# Patient Record
Sex: Female | Born: 1990 | Race: White | Hispanic: No | Marital: Single | Smoking: Current every day smoker
Health system: Southern US, Community
[De-identification: ages and names within clinical notes are randomized; demographics above are authoritative.]

## PROBLEM LIST (undated history)

## (undated) DIAGNOSIS — I10 Essential (primary) hypertension: Secondary | ICD-10-CM

## (undated) DIAGNOSIS — Z9889 Other specified postprocedural states: Secondary | ICD-10-CM

## (undated) DIAGNOSIS — K219 Gastro-esophageal reflux disease without esophagitis: Secondary | ICD-10-CM

## (undated) DIAGNOSIS — Z87442 Personal history of urinary calculi: Secondary | ICD-10-CM

## (undated) DIAGNOSIS — R112 Nausea with vomiting, unspecified: Secondary | ICD-10-CM

## (undated) DIAGNOSIS — O24419 Gestational diabetes mellitus in pregnancy, unspecified control: Secondary | ICD-10-CM

## (undated) HISTORY — PX: TONSILLECTOMY: SUR1361

## (undated) HISTORY — PX: WISDOM TOOTH EXTRACTION: SHX21

## (undated) HISTORY — DX: Essential (primary) hypertension: I10

## (undated) HISTORY — DX: Gestational diabetes mellitus in pregnancy, unspecified control: O24.419

---
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
## 2010-05-04 ENCOUNTER — Encounter: Payer: Self-pay | Admitting: Obstetrics and Gynecology

## 2010-06-08 ENCOUNTER — Encounter: Payer: Self-pay | Admitting: Maternal and Fetal Medicine

## 2010-06-16 ENCOUNTER — Observation Stay: Payer: Self-pay | Admitting: Obstetrics & Gynecology

## 2010-06-18 ENCOUNTER — Inpatient Hospital Stay: Payer: Self-pay | Admitting: Obstetrics & Gynecology

## 2010-06-21 ENCOUNTER — Observation Stay: Payer: Self-pay | Admitting: Obstetrics and Gynecology

## 2010-06-22 ENCOUNTER — Observation Stay: Payer: Self-pay

## 2010-06-26 ENCOUNTER — Observation Stay: Payer: Self-pay | Admitting: Obstetrics and Gynecology

## 2010-06-29 ENCOUNTER — Encounter: Payer: Self-pay | Admitting: Maternal and Fetal Medicine

## 2010-07-06 ENCOUNTER — Encounter: Payer: Self-pay | Admitting: Maternal and Fetal Medicine

## 2010-07-09 ENCOUNTER — Inpatient Hospital Stay: Payer: Self-pay | Admitting: Obstetrics and Gynecology

## 2010-11-25 ENCOUNTER — Emergency Department: Payer: Self-pay | Admitting: Emergency Medicine

## 2010-12-31 ENCOUNTER — Emergency Department: Payer: Self-pay | Admitting: Emergency Medicine

## 2011-01-15 ENCOUNTER — Ambulatory Visit: Payer: Self-pay | Admitting: Urology

## 2011-01-26 ENCOUNTER — Ambulatory Visit: Payer: Self-pay | Admitting: Urology

## 2013-08-22 ENCOUNTER — Emergency Department: Payer: Self-pay | Admitting: Emergency Medicine

## 2014-11-18 ENCOUNTER — Emergency Department: Admit: 2014-11-18 | Disposition: A | Discharge status: Home / Self Care | Payer: Self-pay | Admitting: Student

## 2015-09-30 ENCOUNTER — Encounter: Payer: Self-pay | Admitting: Emergency Medicine

## 2015-09-30 ENCOUNTER — Emergency Department
Admission: EM | Admit: 2015-09-30 | Discharge: 2015-09-30 | Disposition: A | Discharge status: Home / Self Care | Payer: Self-pay | Attending: Emergency Medicine | Admitting: Emergency Medicine

## 2015-09-30 DIAGNOSIS — F17200 Nicotine dependence, unspecified, uncomplicated: Secondary | ICD-10-CM | POA: Insufficient documentation

## 2015-09-30 DIAGNOSIS — M546 Pain in thoracic spine: Secondary | ICD-10-CM | POA: Insufficient documentation

## 2015-09-30 DIAGNOSIS — G8929 Other chronic pain: Secondary | ICD-10-CM | POA: Insufficient documentation

## 2015-09-30 DIAGNOSIS — M62830 Muscle spasm of back: Secondary | ICD-10-CM | POA: Insufficient documentation

## 2015-09-30 DIAGNOSIS — M545 Low back pain: Secondary | ICD-10-CM | POA: Insufficient documentation

## 2015-09-30 MED ORDER — CYCLOBENZAPRINE HCL 10 MG PO TABS: 10.0000 mg | ORAL_TABLET | Freq: Three times a day (TID) | ORAL | Status: DC | PRN

## 2015-09-30 MED ORDER — NAPROXEN 500 MG PO TABS: 500.0000 mg | ORAL_TABLET | Freq: Two times a day (BID) | ORAL | Status: DC

## 2015-09-30 MED ORDER — KETOROLAC TROMETHAMINE 60 MG/2ML IM SOLN
60.0000 mg | Freq: Once | INTRAMUSCULAR | Status: AC
  Administered 2015-09-30: 60 mg via INTRAMUSCULAR
  Filled 2015-09-30: qty 2

## 2015-09-30 NOTE — ED Notes (Signed)
Pt to ed with c/o chronic back pain, worse over the last few days, denies injury.

## 2015-09-30 NOTE — ED Provider Notes (Signed)
[facility] Emergency Department Provider Note  ____________________________________________  Time seen: Approximately 8:34 PM  I have reviewed the triage vital signs and the nursing notes.   HISTORY  Chief Complaint Back Pain    HPI Angelica Fisher is a 25 y.o. female , NAD, presents to the emergency department with lower back pain that is chronic in nature but worsening over the last few days. Denies any injury, trauma, falls. Not had any numbness, tingling, weakness. No saddle paresthesias nor loss of bowel or bladder control. Has been taking over-the-counter medications without any improvement. Notes the pain makes it difficult for her to stand upright. Patient has not been followed over the years that she does not have insurance nor a primary care provider.   History reviewed. No pertinent past medical history.  There are no active problems to display for this patient.   History reviewed. No pertinent past surgical history.  Current Outpatient Rx  Name  Route  Sig  Dispense  Refill  . cyclobenzaprine (FLEXERIL) 10 MG tablet   Oral   Take 1 tablet (10 mg total) by mouth 3 (three) times daily as needed for muscle spasms.   21 tablet   0   . naproxen (NAPROSYN) 500 MG tablet   Oral   Take 1 tablet (500 mg total) by mouth 2 (two) times daily with a meal.   30 tablet   0     Allergies Review of patient's allergies indicates no known allergies.  History reviewed. No pertinent family history.  Social History Social History  Substance Use Topics  . Smoking status: Current Every Day Smoker  . Smokeless tobacco: None  . Alcohol Use: Yes     Review of Systems  Constitutional: No fever/chills Cardiovascular: No chest pain. Respiratory:  No shortness of breath. No wheezing.  Gastrointestinal: No abdominal pain.  No nausea, vomiting.   Musculoskeletal: Positive for back pain.  Skin: Negative for rash. Neurological: Negative for headaches,  focal weakness or numbness. No saddle paresthesias 10-point ROS otherwise negative.  ____________________________________________   PHYSICAL EXAM:  VITAL SIGNS: ED Triage Vitals  Enc Vitals Group     BP 09/30/15 1852 155/90 mmHg     Pulse Rate 09/30/15 1852 86     Resp 09/30/15 1852 20     Temp 09/30/15 1852 98.3 F (36.8 C)     Temp Source 09/30/15 1852 Oral     SpO2 09/30/15 1852 99 %     Weight 09/30/15 1852 250 lb (113.399 kg)     Height 09/30/15 1852  (1.753 m)     Head Cir --      Peak Flow --      Pain Score 09/30/15 1852 8     Pain Loc --      Pain Edu? --      Excl. in GC? --     Constitutional: Alert and oriented. Well appearing and in no acute distress. Eyes: Conjunctivae are normal. PERRL. EOMI without pain.  Head: Atraumatic. Hematological/Lymphatic/Immunilogical: No cervical lymphadenopathy. Cardiovascular:  Good peripheral circulation. Respiratory: Normal respiratory effort without tachypnea or retractions.  Musculoskeletal: Tenderness about the thoracic and lumbar spine is diffusely. No paraspinal tenderness. Mild muscle spasm noted to the right paraspinal lower thoracic region. No lower extremity tenderness nor edema.  No joint effusions. Neurologic:  Normal speech and language. No gross focal neurologic deficits are appreciated.  Skin:  Skin is warm, dry and intact. No rash noted. Psychiatric: Mood and affect  are normal. Speech and behavior are normal. Patient exhibits appropriate insight and judgement.   ____________________________________________   LABS  None  ____________________________________________  EKG  None ____________________________________________  RADIOLOGY  None  ____________________________________________    PROCEDURES  Procedure(s) performed: None    Medications  ketorolac (TORADOL) injection 60 mg (60 mg Intramuscular Given 09/30/15 2050)     ____________________________________________   INITIAL  IMPRESSION / ASSESSMENT AND PLAN / ED COURSE  Patient's diagnosis is consistent with chronic lumbago. Patient noted some improvement in pain after administration of Toradol while in the emergency department. Patient will be discharged home with prescriptions for naproxen and Flexeril to take as prescribed. Patient is to follow up with [facility] community health clinic to establish care and if symptoms persist past this treatment course. Patient is given ED precautions to return to the ED for any worsening or new symptoms.    ____________________________________________  FINAL CLINICAL IMPRESSION(S) / ED DIAGNOSES  Final diagnoses:  Chronic bilateral low back pain without sciatica      NEW MEDICATIONS STARTED DURING THIS VISIT:  New Prescriptions   CYCLOBENZAPRINE (FLEXERIL) 10 MG TABLET    Take 1 tablet (10 mg total) by mouth 3 (three) times daily as needed for muscle spasms.   NAPROXEN (NAPROSYN) 500 MG TABLET    Take 1 tablet (500 mg total) by mouth 2 (two) times daily with a meal.         Hope Pigeon, PA-C 09/30/15 2133  Rockne Menghini, MD 10/01/15 [phone]

## 2015-09-30 NOTE — Discharge Instructions (Signed)

## 2020-05-22 ENCOUNTER — Other Ambulatory Visit: Payer: Self-pay

## 2020-05-22 ENCOUNTER — Ambulatory Visit (LOCAL_COMMUNITY_HEALTH_CENTER): Payer: Self-pay

## 2020-05-22 VITALS — BP 151/98 | Ht 69.0 in | Wt 252.0 lb

## 2020-05-22 DIAGNOSIS — Z3201 Encounter for pregnancy test, result positive: Secondary | ICD-10-CM

## 2020-05-22 MED ORDER — PRENATAL 27-0.8 MG PO TABS: 1.0000 | ORAL_TABLET | Freq: Every day | ORAL | 0 refills | Status: AC

## 2020-05-22 NOTE — Progress Notes (Signed)
UPT positive today. BP elevated today at 151/98. Reports hx of HBP. No follow up with BP over past yr. Currently, not taking HBP meds although has been prescribed in past by Phineas Real. Reports productive cough with clear mucus since Saturday. Denies fever. RN observed pt coughing persistently during office visit today. Consult Hazle Coca, CNM who advises pt to be tested for COVID today. Recommends hot tea/honey and cough drops for cough. Advises pt to establish prenatal care ASAP based on HBP and past  high risk OB history. RN reviewed provider recommendations with pt. [employer] information given (handout) to pt for COVID testing. To seek immediate medical attn. If symptoms worsen. Pt plans prenatal care at [facility] and plans to call tomorrow. Questions answered and reports understanding. Jerel Shepherd, RN

## 2020-05-23 LAB — PREGNANCY, URINE: Preg Test, Ur: POSITIVE — AB

## 2020-05-27 NOTE — Progress Notes (Signed)
Consulted on the plan of care for this client.  I agree with the documented note and actions taken to provide care for this client. 

## 2020-06-03 DIAGNOSIS — O3680X0 Pregnancy with inconclusive fetal viability, not applicable or unspecified: Secondary | ICD-10-CM | POA: Diagnosis not present

## 2020-06-03 DIAGNOSIS — Z3201 Encounter for pregnancy test, result positive: Secondary | ICD-10-CM | POA: Diagnosis not present

## 2020-06-03 DIAGNOSIS — N912 Amenorrhea, unspecified: Secondary | ICD-10-CM | POA: Diagnosis not present

## 2020-06-09 NOTE — Progress Notes (Signed)
Follow-up on +PT at [facility] on 05/22/20. Patient has new OB appointment scheduled at [facility] on 07/08/20 per [facility] notes in Epic.Marland KitchenBurt Knack, RN

## 2020-07-07 DIAGNOSIS — O0992 Supervision of high risk pregnancy, unspecified, second trimester: Secondary | ICD-10-CM | POA: Insufficient documentation

## 2020-07-08 DIAGNOSIS — Z3481 Encounter for supervision of other normal pregnancy, first trimester: Secondary | ICD-10-CM | POA: Diagnosis not present

## 2020-07-08 DIAGNOSIS — O0991 Supervision of high risk pregnancy, unspecified, first trimester: Secondary | ICD-10-CM | POA: Diagnosis not present

## 2020-07-08 DIAGNOSIS — Z114 Encounter for screening for human immunodeficiency virus [HIV]: Secondary | ICD-10-CM | POA: Diagnosis not present

## 2020-07-08 DIAGNOSIS — Z124 Encounter for screening for malignant neoplasm of cervix: Secondary | ICD-10-CM | POA: Diagnosis not present

## 2020-07-08 DIAGNOSIS — O10011 Pre-existing essential hypertension complicating pregnancy, first trimester: Secondary | ICD-10-CM | POA: Diagnosis not present

## 2020-07-08 DIAGNOSIS — Z6837 Body mass index (BMI) 37.0-37.9, adult: Secondary | ICD-10-CM | POA: Diagnosis not present

## 2020-07-08 DIAGNOSIS — Z113 Encounter for screening for infections with a predominantly sexual mode of transmission: Secondary | ICD-10-CM | POA: Diagnosis not present

## 2020-07-08 LAB — OB RESULTS CONSOLE RUBELLA ANTIBODY, IGM: Rubella: IMMUNE

## 2020-07-08 LAB — OB RESULTS CONSOLE RPR: RPR: NONREACTIVE

## 2020-07-08 LAB — OB RESULTS CONSOLE GC/CHLAMYDIA
Chlamydia: NEGATIVE
Gonorrhea: NEGATIVE

## 2020-07-08 LAB — OB RESULTS CONSOLE VARICELLA ZOSTER ANTIBODY, IGG: Varicella: IMMUNE

## 2020-07-08 LAB — OB RESULTS CONSOLE HIV ANTIBODY (ROUTINE TESTING): HIV: NONREACTIVE

## 2020-07-08 LAB — OB RESULTS CONSOLE HEPATITIS B SURFACE ANTIGEN: Hepatitis B Surface Ag: NEGATIVE

## 2020-07-21 DIAGNOSIS — I10 Essential (primary) hypertension: Secondary | ICD-10-CM | POA: Diagnosis not present

## 2020-07-21 DIAGNOSIS — R7309 Other abnormal glucose: Secondary | ICD-10-CM | POA: Diagnosis not present

## 2020-08-05 DIAGNOSIS — Z3480 Encounter for supervision of other normal pregnancy, unspecified trimester: Secondary | ICD-10-CM | POA: Diagnosis not present

## 2020-08-05 DIAGNOSIS — O10012 Pre-existing essential hypertension complicating pregnancy, second trimester: Secondary | ICD-10-CM | POA: Diagnosis not present

## 2020-08-05 DIAGNOSIS — G8929 Other chronic pain: Secondary | ICD-10-CM | POA: Diagnosis not present

## 2020-08-05 DIAGNOSIS — O24419 Gestational diabetes mellitus in pregnancy, unspecified control: Secondary | ICD-10-CM | POA: Diagnosis not present

## 2020-08-05 DIAGNOSIS — M25562 Pain in left knee: Secondary | ICD-10-CM | POA: Diagnosis not present

## 2020-08-06 ENCOUNTER — Encounter: Payer: Self-pay | Admitting: *Deleted

## 2020-08-06 ENCOUNTER — Other Ambulatory Visit: Payer: Self-pay

## 2020-08-06 ENCOUNTER — Encounter: Payer: Medicaid Other | Attending: Certified Nurse Midwife | Admitting: *Deleted

## 2020-08-06 VITALS — BP 120/78 | Ht 69.0 in | Wt 262.8 lb

## 2020-08-06 DIAGNOSIS — Z3A00 Weeks of gestation of pregnancy not specified: Secondary | ICD-10-CM | POA: Diagnosis not present

## 2020-08-06 DIAGNOSIS — O24410 Gestational diabetes mellitus in pregnancy, diet controlled: Secondary | ICD-10-CM

## 2020-08-06 DIAGNOSIS — O24419 Gestational diabetes mellitus in pregnancy, unspecified control: Secondary | ICD-10-CM | POA: Diagnosis present

## 2020-08-06 NOTE — Progress Notes (Signed)
Diabetes Self-Management Education  Visit Type: First/Initial  Appt. Start Time: 0815 Appt. End Time: 1015  08/06/2020  [name], identified by name and date of birth, is a 29 y.o. female with a diagnosis of Diabetes: Gestational Diabetes.   ASSESSMENT  Blood pressure 120/78, height 5\' 9"  (1.753 m), weight 262 lb 12.8 oz (119.2 kg), last menstrual period 04/14/2020, estimated date of delivery 01/19/2021 Body mass index is 38.81 kg/m.   Diabetes Self-Management Education - 08/06/20 1135      Visit Information   Visit Type First/Initial      Initial Visit   Diabetes Type Gestational Diabetes    Are you currently following a meal plan? No    Are you taking your medications as prescribed? Yes    Date Diagnosed 3 weeks ago      Health Coping   How would you rate your overall health? Good      Psychosocial Assessment   Patient Belief/Attitude about Diabetes Other (comment)   "stressed"   Self-care barriers None    Self-management support Doctor's office;Family;Friends    Patient Concerns Nutrition/Meal planning;Glycemic Control;Medication;Monitoring;Healthy Lifestyle    Special Needs None    Preferred Learning Style Auditory;Hands on    Learning Readiness Ready    How often do you need to have someone help you when you read instructions, pamphlets, or other written materials from your doctor or pharmacy? 1 - Never    What is the last grade level you completed in school? GED      Pre-Education Assessment   Patient understands the diabetes disease and treatment process. Needs Instruction    Patient understands incorporating nutritional management into lifestyle. Needs Instruction    Patient undertands incorporating physical activity into lifestyle. Needs Instruction    Patient understands using medications safely. Needs Instruction    Patient understands monitoring blood glucose, interpreting and using results Needs Instruction    Patient understands prevention,  detection, and treatment of acute complications. Needs Instruction    Patient understands prevention, detection, and treatment of chronic complications. Needs Instruction    Patient understands how to develop strategies to address psychosocial issues. Needs Instruction    Patient understands how to develop strategies to promote health/change behavior. Needs Instruction      Complications   Last HgB A1C per patient/outside source 5.5 %   07/08/2020   How often do you check your blood sugar? 0 times/day (not testing)   Provided Accu-Chek Guide Me meter and instructed on use. BG upon return demonstration was 150 mg/dL at 07/10/2020 am - 2 hrs pp.   Have you had a dilated eye exam in the past 12 months? No    Have you had a dental exam in the past 12 months? No    Are you checking your feet? Yes    How many days per week are you checking your feet? 7      Dietary Intake   Breakfast cheese crackers, string cheese    Snack (morning) cheese sticks, fruit (berries, grapes, banana)    Lunch meat sandwich, peanut butter and jelly sandwich, salad with lettuce cheese and crotons    Snack (afternoon) same as morning    Dinner chicken, beef, pork; potatoes, peas, beans, corn, broccoli, pasta, green beans    Snack (evening) same as morning    Beverage(s) water, milk, fruit juice, diet soda      Exercise   Exercise Type ADL's      Patient Education   Previous Diabetes  Education No    Disease state  Definition of diabetes, type 1 and 2, and the diagnosis of diabetes;Factors that contribute to the development of diabetes    Nutrition management  Role of diet in the treatment of diabetes and the relationship between the three main macronutrients and blood glucose level;Food label reading, portion sizes and measuring food.;Reviewed blood glucose goals for pre and post meals and how to evaluate the patients' food intake on their blood glucose level.    Physical activity and exercise  Role of exercise on diabetes  management, blood pressure control and cardiac health.    Medications Other (comment)   Limited use of oral medications during pregnancy and potential for insulin.   Monitoring Taught/evaluated SMBG meter.;Purpose and frequency of SMBG.;Taught/discussed recording of test results and interpretation of SMBG.;Ketone testing, when, how.    Chronic complications Relationship between chronic complications and blood glucose control    Psychosocial adjustment Identified and addressed patients feelings and concerns about diabetes    Preconception care Pregnancy and GDM  Role of pre-pregnancy blood glucose control on the development of the fetus;Reviewed with patient blood glucose goals with pregnancy;Role of family planning for patients with diabetes      Individualized Goals (developed by patient)   Reducing Risk Other (comment)   improve blood sugars, decrease medications, prevent diabetes complications, lead a healthier lifestyle, become more fit     Outcomes   Expected Outcomes Demonstrated interest in learning. Expect positive outcomes    Future DMSE 2 wks           Individualized Plan for Diabetes Self-Management Training:   Learning Objective:  Patient will have a greater understanding of diabetes self-management. Patient education plan is to attend individual and/or group sessions per assessed needs and concerns.   Plan:   Patient Instructions  Read booklet on Gestational Diabetes Follow Gestational Meal Planning Guidelines Allow 2-3 hours between meals and snacks Avoid sugar sweetened drinks (juice) Complete a 3 Day Food Record and bring to next appointment Check blood sugars 4 x day - before breakfast and 2 hrs after every meal and record  Bring blood sugar log to all appointments Call MD for prescription for meter strips and lancets Strips   Accu-Chek Guide Lancets   Accu-Chek Softclix Purchase urine ketone strips if instructed by MD and check urine ketones every am:  If +  increase bedtime snack to 1 protein and 2 carbohydrate servings Walk 20-30 minutes at least 5 x week if permitted by MD  Expected Outcomes:  Demonstrated interest in learning. Expect positive outcomes  Education material provided:  Gestational Booklet Gestational Meal Planning Guidelines Simple Meal Plan Viewed Gestational Diabetes Video Meter = Accu-Chek Guide Me 3 Day Food Record Goals for a Healthy Pregnancy  If problems or questions, patient to contact team via:  Sharion Settler, RN, CCM, CDCES [phone]  Future DSME appointment: 2 wks  Aug 20, 2020 with the dietitian

## 2020-08-06 NOTE — Patient Instructions (Signed)
Read booklet on Gestational Diabetes Follow Gestational Meal Planning Guidelines Allow 2-3 hours between meals and snacks Avoid sugar sweetened drinks (juice) Complete a 3 Day Food Record and bring to next appointment Check blood sugars 4 x day - before breakfast and 2 hrs after every meal and record  Bring blood sugar log to all appointments Call MD for prescription for meter strips and lancets Strips   Accu-Chek Guide Lancets   Accu-Chek Softclix Purchase urine ketone strips if instructed by MD and check urine ketones every am:  If + increase bedtime snack to 1 protein and 2 carbohydrate servings Walk 20-30 minutes at least 5 x week if permitted by MD

## 2020-08-20 ENCOUNTER — Other Ambulatory Visit: Payer: Self-pay

## 2020-08-20 ENCOUNTER — Encounter: Payer: Self-pay | Admitting: Dietician

## 2020-08-20 ENCOUNTER — Encounter: Payer: Medicaid Other | Attending: Certified Nurse Midwife | Admitting: Dietician

## 2020-08-20 VITALS — BP 134/70 | Ht 69.0 in | Wt 259.4 lb

## 2020-08-20 DIAGNOSIS — O24410 Gestational diabetes mellitus in pregnancy, diet controlled: Secondary | ICD-10-CM | POA: Insufficient documentation

## 2020-08-20 NOTE — Progress Notes (Signed)
.   Patient's BG record indicates fasting BGs ranging 125-164, and post-meal BGs ranging 96-188. She reports having some low blood sugar symptoms when BG was 96 after lunch. She ate a snack and felt better. . Patient's food diary indicates controlled amounts of carbohydrate with meals, and inclusion of protein sources regularly. She is including vegetables with most meals. . Provided basic balanced meal plan, and wrote individualized menus based on patient's food preferences. . Instructed patient on basic meal planning, appropriate food portions, and meal options. . Instructed patient on food safety, including avoidance of Listeriosis, and limiting mercury from fish. . Discussed importance of maintaining healthy lifestyle habits to reduce risk of Type 2 DM as well as Gestational DM with any future pregnancies. . Advised patient to use any remaining testing supplies to test some BGs after delivery, and to have BG tested ideally annually, as well as prior to attempting future pregnancies.

## 2020-08-20 NOTE — Patient Instructions (Signed)
   Continue with current eating pattern, keeping carb intake consistent at 30-45g each meal.   Incorporate light activity as able during the day; a few minutes of moving after eating can sometimes improve post-meal blood sugars.

## 2020-08-21 DIAGNOSIS — I10 Essential (primary) hypertension: Secondary | ICD-10-CM | POA: Diagnosis not present

## 2020-08-21 DIAGNOSIS — R0602 Shortness of breath: Secondary | ICD-10-CM | POA: Insufficient documentation

## 2020-08-21 DIAGNOSIS — O0992 Supervision of high risk pregnancy, unspecified, second trimester: Secondary | ICD-10-CM | POA: Diagnosis not present

## 2020-08-21 DIAGNOSIS — R600 Localized edema: Secondary | ICD-10-CM | POA: Insufficient documentation

## 2020-08-21 DIAGNOSIS — O24419 Gestational diabetes mellitus in pregnancy, unspecified control: Secondary | ICD-10-CM | POA: Diagnosis not present

## 2020-08-21 DIAGNOSIS — Z3689 Encounter for other specified antenatal screening: Secondary | ICD-10-CM | POA: Diagnosis not present

## 2020-08-27 ENCOUNTER — Encounter: Payer: Medicaid Other | Admitting: *Deleted

## 2020-08-27 ENCOUNTER — Other Ambulatory Visit: Payer: Self-pay

## 2020-08-27 ENCOUNTER — Encounter: Payer: Self-pay | Admitting: *Deleted

## 2020-08-27 VITALS — BP 124/62 | Wt 265.3 lb

## 2020-08-27 DIAGNOSIS — O24414 Gestational diabetes mellitus in pregnancy, insulin controlled: Secondary | ICD-10-CM

## 2020-08-27 DIAGNOSIS — O24410 Gestational diabetes mellitus in pregnancy, diet controlled: Secondary | ICD-10-CM | POA: Diagnosis not present

## 2020-08-27 NOTE — Progress Notes (Signed)
Diabetes Self-Management Education  Visit Type: Follow-up  Appt. Start Time: 1050 Appt. End Time: 1140  08/27/2020  Ms. Bonney Aid, identified by name and date of birth, is a 30 y.o. female with a diagnosis of Diabetes:  .   ASSESSMENT  Blood pressure 124/62, weight 265 lb 4.8 oz (120.3 kg), last menstrual period 04/14/2020, estimated date of delivery 01/19/2021 Body mass index is 39.18 kg/m.   Diabetes Self-Management Education - 08/27/20 1200      Visit Information   Visit Type Follow-up      Complications   How often do you check your blood sugar? 3-4 times/day    Fasting Blood glucose range (mg/dL) 13-244;010-272   FBG's in the last week range from 112-167 mg/dL - 7/7 elevated   Postprandial Blood glucose range (mg/dL) 53-664;403-474;259-563   pp's breakfast 111-153 mg/dL 2/4 elevated; pp's lunch 113-171 mg.dL 5/6 elevated; pp's supper 128-183 mg/dL 6/6 elevated   Number of hypoglycemic episodes per month 0    Have you had a dilated eye exam in the past 12 months? No    Have you had a dental exam in the past 12 months? No    Are you checking your feet? Yes    How many days per week are you checking your feet? 7      Dietary Intake   Breakfast 3 meals and 2 snacks/day      Exercise   Exercise Type ADL's      Patient Education   Nutrition management  Reviewed blood glucose goals for pre and post meals and how to evaluate the patients' food intake on their blood glucose level.    Physical activity and exercise  Role of exercise on diabetes management, blood pressure control and cardiac health.    Medications Taught/reviewed insulin injection, site rotation, insulin storage and needle disposal.;Reviewed patients medication for diabetes, action, purpose, timing of dose and side effects.   Pt injected 5 units NS to left abdomen subcutaneously without difficulty   Monitoring Taught/discussed recording of test results and interpretation of SMBG.;Identified appropriate SMBG  and/or A1C goals.    Acute complications Taught treatment of hypoglycemia - the 15 rule.    Preconception care Pregnancy and GDM  Role of pre-pregnancy blood glucose control on the development of the fetus;Reviewed with patient blood glucose goals with pregnancy      Post-Education Assessment   Patient understands incorporating nutritional management into lifestyle. Demonstrates understanding / competency    Patient undertands incorporating physical activity into lifestyle. Needs Review    Patient understands using medications safely. Demonstrates understanding / competency    Patient understands monitoring blood glucose, interpreting and using results Demonstrates understanding / competency    Patient understands prevention, detection, and treatment of acute complications. Demonstrates understanding / competency      Outcomes   Expected Outcomes Demonstrated interest in learning. Expect positive outcomes    Program Status Completed      Subsequent Visit   Since your last visit have you continued or begun to take your medications as prescribed? Yes    Since your last visit have you had your blood pressure checked? Yes    Is your most recent blood pressure lower, unchanged, or higher since your last visit? Unchanged    Since your last visit have you experienced any weight changes? Gain    Weight Gain (lbs) 6    Since your last visit, are you checking your blood glucose at least once a day? Yes  Individualized Plan for Diabetes Self-Management Training:   Learning Objective:  Patient will have a greater understanding of diabetes self-management. Patient education plan is to attend individual and/or group sessions per assessed needs and concerns.   Plan:   Patient Instructions     Follow Gestational Meal Planning Guidelines Check blood sugars 4 x day - before breakfast and 2 hrs after every meal and record  Bring blood sugar log to MD appointments  Walk 20-30 minutes at  least 5 x week if permitted by MD  Carry fast acting glucose and a snack at all times Rotate injection sites Give morning insulin 30 minutes before breakfast and evening insulin 30 minutes before supper  Morning insulin Humulin/Novolin    N  (cloudy)     30  units Humulin/Novolin    R  (clear)      20     units                                            50     units  Evening insulin Humulin/Novolin    N (cloudy)     14     units Humulin/Novolin    R (clear)      14     units                    28     units  Expected Outcomes:  Demonstrated interest in learning. Expect positive outcomes  Education material provided:  Injection Guide (BD) Glucose tablets Symptoms, causes and treatments of Hypoglycemia  If problems or questions, patient to contact team via:  Sharion Settler, RN, CCM, CDCES [phone]  Future DSME appointment: PRN

## 2020-08-27 NOTE — Patient Instructions (Signed)
  Follow Gestational Meal Planning Guidelines Check blood sugars 4 x day - before breakfast and 2 hrs after every meal and record  Bring blood sugar log to MD appointments  Walk 20-30 minutes at least 5 x week if permitted by MD  Carry fast acting glucose and a snack at all times Rotate injection sites Give morning insulin 30 minutes before breakfast and evening insulin 30 minutes before supper  Morning insulin Humulin/Novolin    N  (cloudy)     30  units Humulin/Novolin    R  (clear)      20     units                                            50     units  Evening insulin Humulin/Novolin    N (cloudy)     14     units Humulin/Novolin    R (clear)      14     units                    28     units

## 2020-08-28 ENCOUNTER — Telehealth: Payer: Self-pay | Admitting: *Deleted

## 2020-08-28 NOTE — Telephone Encounter (Signed)
Phone call to patient to follow up on insulin administration. Pt reported that [facility] didn't have R insulin and just gave her N insulin but didn't tell her. She saw only one insulin when she got home. She reports that she had to pay for her syringes because they didn't have prescription. She called MD office and when they contacted [facility] they were told that her insulin is back orded and should be available today. She will pick up today or tomorrow.

## 2020-09-01 ENCOUNTER — Telehealth: Payer: Self-pay | Admitting: *Deleted

## 2020-09-01 NOTE — Telephone Encounter (Signed)
Phone call to follow up with insulin administration. She reports that she received her insulin and was given 50 unit syringes. Discussed that if they increase her dosage she will need 100 unit syringes since she is taking 50 units total for am dose. She reports doing well with injections but became sick after first injection because she didn't eat much. She didn't check her blood sugar during this episode as she was in the car. Instructed her to eat within 30 minutes of injections. She reports no problems since then. Her fasting blood sugars are still high - 137 mg/dL this morning but pp lunch was 106 mg/dL. MD may need to increase N at supper. She has appt on Wed. No reported blood sugars less than 70 mg/dL. Instructed her to call for any questions.

## 2020-09-03 DIAGNOSIS — O24414 Gestational diabetes mellitus in pregnancy, insulin controlled: Secondary | ICD-10-CM | POA: Diagnosis not present

## 2020-09-03 DIAGNOSIS — O0992 Supervision of high risk pregnancy, unspecified, second trimester: Secondary | ICD-10-CM | POA: Diagnosis not present

## 2020-09-03 DIAGNOSIS — Z23 Encounter for immunization: Secondary | ICD-10-CM | POA: Diagnosis not present

## 2020-09-03 DIAGNOSIS — O10912 Unspecified pre-existing hypertension complicating pregnancy, second trimester: Secondary | ICD-10-CM | POA: Diagnosis not present

## 2020-09-18 DIAGNOSIS — O24414 Gestational diabetes mellitus in pregnancy, insulin controlled: Secondary | ICD-10-CM | POA: Diagnosis not present

## 2020-09-18 DIAGNOSIS — O10912 Unspecified pre-existing hypertension complicating pregnancy, second trimester: Secondary | ICD-10-CM | POA: Diagnosis not present

## 2020-09-18 DIAGNOSIS — O0992 Supervision of high risk pregnancy, unspecified, second trimester: Secondary | ICD-10-CM | POA: Diagnosis not present

## 2020-09-29 ENCOUNTER — Other Ambulatory Visit: Payer: Self-pay | Admitting: Obstetrics and Gynecology

## 2020-09-29 DIAGNOSIS — O10919 Unspecified pre-existing hypertension complicating pregnancy, unspecified trimester: Secondary | ICD-10-CM

## 2020-09-29 DIAGNOSIS — O24912 Unspecified diabetes mellitus in pregnancy, second trimester: Secondary | ICD-10-CM | POA: Diagnosis not present

## 2020-09-29 DIAGNOSIS — Z3A24 24 weeks gestation of pregnancy: Secondary | ICD-10-CM | POA: Diagnosis not present

## 2020-09-29 DIAGNOSIS — O24414 Gestational diabetes mellitus in pregnancy, insulin controlled: Secondary | ICD-10-CM

## 2020-09-30 ENCOUNTER — Ambulatory Visit: Payer: Medicaid Other

## 2020-09-30 ENCOUNTER — Ambulatory Visit: Payer: Medicaid Other | Attending: Obstetrics and Gynecology

## 2020-09-30 ENCOUNTER — Other Ambulatory Visit: Payer: Self-pay

## 2020-09-30 ENCOUNTER — Ambulatory Visit (HOSPITAL_BASED_OUTPATIENT_CLINIC_OR_DEPARTMENT_OTHER): Payer: Medicaid Other | Admitting: Obstetrics and Gynecology

## 2020-09-30 ENCOUNTER — Institutional Professional Consult (permissible substitution): Payer: Medicaid Other

## 2020-09-30 DIAGNOSIS — E669 Obesity, unspecified: Secondary | ICD-10-CM | POA: Diagnosis not present

## 2020-09-30 DIAGNOSIS — Z79899 Other long term (current) drug therapy: Secondary | ICD-10-CM | POA: Diagnosis not present

## 2020-09-30 DIAGNOSIS — Z87891 Personal history of nicotine dependence: Secondary | ICD-10-CM | POA: Insufficient documentation

## 2020-09-30 DIAGNOSIS — O10012 Pre-existing essential hypertension complicating pregnancy, second trimester: Secondary | ICD-10-CM

## 2020-09-30 DIAGNOSIS — O34219 Maternal care for unspecified type scar from previous cesarean delivery: Secondary | ICD-10-CM

## 2020-09-30 DIAGNOSIS — O24414 Gestational diabetes mellitus in pregnancy, insulin controlled: Secondary | ICD-10-CM

## 2020-09-30 DIAGNOSIS — O10912 Unspecified pre-existing hypertension complicating pregnancy, second trimester: Secondary | ICD-10-CM | POA: Diagnosis not present

## 2020-09-30 DIAGNOSIS — Z3A24 24 weeks gestation of pregnancy: Secondary | ICD-10-CM

## 2020-09-30 DIAGNOSIS — O10919 Unspecified pre-existing hypertension complicating pregnancy, unspecified trimester: Secondary | ICD-10-CM

## 2020-09-30 DIAGNOSIS — O99212 Obesity complicating pregnancy, second trimester: Secondary | ICD-10-CM

## 2020-09-30 DIAGNOSIS — O3680X0 Pregnancy with inconclusive fetal viability, not applicable or unspecified: Secondary | ICD-10-CM | POA: Insufficient documentation

## 2020-09-30 DIAGNOSIS — Z363 Encounter for antenatal screening for malformations: Secondary | ICD-10-CM | POA: Insufficient documentation

## 2020-09-30 DIAGNOSIS — Z8249 Family history of ischemic heart disease and other diseases of the circulatory system: Secondary | ICD-10-CM | POA: Insufficient documentation

## 2020-09-30 DIAGNOSIS — Z8673 Personal history of transient ischemic attack (TIA), and cerebral infarction without residual deficits: Secondary | ICD-10-CM | POA: Diagnosis not present

## 2020-09-30 DIAGNOSIS — Z3689 Encounter for other specified antenatal screening: Secondary | ICD-10-CM | POA: Insufficient documentation

## 2020-09-30 DIAGNOSIS — O0992 Supervision of high risk pregnancy, unspecified, second trimester: Secondary | ICD-10-CM | POA: Diagnosis not present

## 2020-09-30 DIAGNOSIS — Z7982 Long term (current) use of aspirin: Secondary | ICD-10-CM | POA: Diagnosis not present

## 2020-09-30 DIAGNOSIS — L299 Pruritus, unspecified: Secondary | ICD-10-CM | POA: Diagnosis not present

## 2020-09-30 LAB — GLUCOSE, CAPILLARY: Glucose-Capillary: 153 mg/dL — ABNORMAL HIGH (ref 70–99)

## 2020-09-30 NOTE — Addendum Note (Signed)
Addended by: Noralee Space on: 09/30/2020 03:45 PM   Modules accepted: Level of Service

## 2020-09-30 NOTE — Progress Notes (Addendum)
Maternal-Fetal Medicine   Name: Angelica Fisher DOB: 1991/01/23 MRN: [id number] Referring Provider: Heloise Ochoa, CNM  I had the pleasure of seeing Ms. Lainez today at the [facility], [facility].  She is G3 P1 at 24w 1d with chronic hypertension and early-onset gestational diabetes. She is here for ultrasound and consultation.  Early screening established the diagnosis of gestational diabetes. Recent HbA1c was 5.5%. Patient reports till recently (dosages increased but the patient does not remember) she was taking insulin NPH 34 units in the morning and 24 units in the evening, and Humalog 20 units in the morning and 14 units in the evening. Her blood glucose levels are improving but still not within normal range. Chronic hypertension was diagnosed at age 23. She takes labetalol 400 mg in the morning and 200 mg in the evening and reports her blood pressures are well controlled. Patient reports she checks her blood pressure by Apple Watch.  She does not give history of any other chronic medical conditions. She has an appointment with her cardiologist next month (long-standing hypertension). Recent EKG was reported as normal.  PSH: Cesarean section. Medications: Insulin, labetalol, prenatal vitamins, aspirin, Pepcid. Allergies: NKDA. Social: Ex-smoker; smoked  PPD before pregnancy. No history of alcohol or drug use. She is single and her partner is Caucasian, and he is in good health. Family: Mother has COPD and HTN. Father has hypertension. She has a brother who is in good health. No history of venous thromboembolism.  Obstetric history is significant for a term cesarean delivery (failure to progress in labor) in 2011 of a female infant weighing 8-11 at birth. Her son is in good health. Gyn history: No history of abnormal Pap smears or cervical surgeries. No history of breast disease. Her previous menstrual cycles were regular.  Prenatal course: On cell-free fetal DNA screening, the risks of  fetal aneuploidies are not increased. MSAFP screening showed low risk for open-neural tube defects. She had fetal echocardiography that was reported as normal.  BP at our office today is 120/57 mm Hg.   Ultrasound We performed a fetal anatomy scan. No markers of aneuploidies or fetal structural defects are seen. Fetal biometry is consistent with her previously-established dates. Amniotic fluid is normal and good fetal activity is seen. Patient understands the limitations of ultrasound in detecting fetal anomalies.  Placenta is anterior and there is no evidence of previa or placenta accreta spectrum.  Maternal obesity imposes limitations on the resolution of images, and failure to detect fetal anomalies is more common in obese pregnant women.    Our concerns include: Chronic hypertension I counseled the patient on the possible adverse effects from uncontrolled hypertension. Adverse effects in pregnancy correlate well with the severity of hypertension and the duration of hypertension. Maternal complications (in poorly-controlled hypertension) include stroke, pulmonary edema, and renal failure. Placental abruption fetal growth restriction. Superimposed preeclamspsia occurs in up to 30% of patients with chronic hypertension.   Most antihypertensives prescribed in pregnancy have established safety-profiles. Labetolol and methyldopa are the most-commonly used drugs. Some reports have shown that labetalol is associated with mild fetal growth restriction. No increased congenital malformations have been noted with these drugs. Calcium channel blocker (nifedipine) can also be used as part of multi-drug regimen.  Fetal surveillance: Patient has gestational diabetes and takes insulin. We recommend serial growth scans to monitor fetal growth, and initiating weekly antepartum testing (BPP) from [redacted] weeks gestation until delivery. If hypertension is severe, early initiation and more frequent antenatal testing  should be performed.  Timing of delivery: Consider delivery at 39 weeks provided her hypertension is well controlled. If her blood pressure is poorly-controlled, earlier delivery may be considered. Low-dose aspirin delays or prevents preeclampsia. I encouraged the patient to continue low-dose aspirin till delivery.   Gestational diabetes -Usually resolves after pregnancy. In about 25% to 40% of cases, type 2 diabetes can develop, and we recommend postpartum screening with 75-g glucose at 6 to 12 weeks after delivery. -I discussed normal blood glucose parameters.  -I counseled the patient on the possible complications including macrosomia, birth injuries, neonatal complications (respiratory-distress syndrome, hypoglycemia, metabolic disturbances) and stillbirth (in poorly-controlled diabetes). -Discussed ultrasound protocol and frequency of antenatal testing.  Previous cesarean delivery: Patient is keen on repeat cesarean delivery and bilateral tubal ligation. She was made aware that repeat cesarean deliveries increase the likelihood of placenta previa or accreta spectrum. I reassured her of today's ultrasound finding of normal appearance of placenta.  Recommendations: -Fetal growth assessment every 4 weeks. -We made an appointment for her to return in 4 weeks for fetal growth assessment. -Weekly BPP from 32 weeks' gestation till delivery. -Delivery at 39 weeks provided both hypertension and diabetes are well controlled. Early term delivery (37 or 38 weeks) should be considered if they are not well controlled. -Continue labetalol and increase dosage as necessary. Alternatively, nifedipine can be added to control hypetension. -Postpartum screening for type 2 diabetes (6 to 12 weeks after delivery).   Thank you for consultation. Please contact me if you have any questions.  Consultation including face-to-face counseling 45 minutes.

## 2020-10-06 DIAGNOSIS — O24414 Gestational diabetes mellitus in pregnancy, insulin controlled: Secondary | ICD-10-CM | POA: Diagnosis not present

## 2020-10-06 DIAGNOSIS — O0992 Supervision of high risk pregnancy, unspecified, second trimester: Secondary | ICD-10-CM | POA: Diagnosis not present

## 2020-10-06 DIAGNOSIS — O10913 Unspecified pre-existing hypertension complicating pregnancy, third trimester: Secondary | ICD-10-CM | POA: Diagnosis not present

## 2020-10-06 DIAGNOSIS — O98513 Other viral diseases complicating pregnancy, third trimester: Secondary | ICD-10-CM | POA: Diagnosis not present

## 2020-10-07 ENCOUNTER — Other Ambulatory Visit: Payer: Medicaid Other

## 2020-10-15 DIAGNOSIS — O24414 Gestational diabetes mellitus in pregnancy, insulin controlled: Secondary | ICD-10-CM | POA: Diagnosis not present

## 2020-10-15 DIAGNOSIS — O0992 Supervision of high risk pregnancy, unspecified, second trimester: Secondary | ICD-10-CM | POA: Diagnosis not present

## 2020-10-15 DIAGNOSIS — O10913 Unspecified pre-existing hypertension complicating pregnancy, third trimester: Secondary | ICD-10-CM | POA: Diagnosis not present

## 2020-10-22 ENCOUNTER — Other Ambulatory Visit: Payer: Self-pay | Admitting: Obstetrics and Gynecology

## 2020-10-22 DIAGNOSIS — O10013 Pre-existing essential hypertension complicating pregnancy, third trimester: Secondary | ICD-10-CM

## 2020-10-22 DIAGNOSIS — O99213 Obesity complicating pregnancy, third trimester: Secondary | ICD-10-CM

## 2020-10-22 DIAGNOSIS — O24414 Gestational diabetes mellitus in pregnancy, insulin controlled: Secondary | ICD-10-CM

## 2020-10-24 DIAGNOSIS — R600 Localized edema: Secondary | ICD-10-CM | POA: Diagnosis not present

## 2020-10-24 DIAGNOSIS — O98512 Other viral diseases complicating pregnancy, second trimester: Secondary | ICD-10-CM | POA: Diagnosis not present

## 2020-10-24 DIAGNOSIS — O0992 Supervision of high risk pregnancy, unspecified, second trimester: Secondary | ICD-10-CM | POA: Diagnosis not present

## 2020-10-24 DIAGNOSIS — O24414 Gestational diabetes mellitus in pregnancy, insulin controlled: Secondary | ICD-10-CM | POA: Diagnosis not present

## 2020-10-24 DIAGNOSIS — I10 Essential (primary) hypertension: Secondary | ICD-10-CM | POA: Diagnosis not present

## 2020-10-24 DIAGNOSIS — Z23 Encounter for immunization: Secondary | ICD-10-CM | POA: Diagnosis not present

## 2020-10-24 DIAGNOSIS — R0602 Shortness of breath: Secondary | ICD-10-CM | POA: Diagnosis not present

## 2020-10-24 DIAGNOSIS — O10912 Unspecified pre-existing hypertension complicating pregnancy, second trimester: Secondary | ICD-10-CM | POA: Diagnosis not present

## 2020-10-28 ENCOUNTER — Other Ambulatory Visit: Payer: Self-pay

## 2020-10-28 ENCOUNTER — Ambulatory Visit: Payer: Medicaid Other | Attending: Maternal & Fetal Medicine

## 2020-10-28 DIAGNOSIS — O99213 Obesity complicating pregnancy, third trimester: Secondary | ICD-10-CM

## 2020-10-28 DIAGNOSIS — O10013 Pre-existing essential hypertension complicating pregnancy, third trimester: Secondary | ICD-10-CM

## 2020-10-28 DIAGNOSIS — E119 Type 2 diabetes mellitus without complications: Secondary | ICD-10-CM | POA: Diagnosis not present

## 2020-10-28 DIAGNOSIS — Z3A28 28 weeks gestation of pregnancy: Secondary | ICD-10-CM

## 2020-10-28 DIAGNOSIS — O24414 Gestational diabetes mellitus in pregnancy, insulin controlled: Secondary | ICD-10-CM | POA: Diagnosis not present

## 2020-10-30 DIAGNOSIS — O10913 Unspecified pre-existing hypertension complicating pregnancy, third trimester: Secondary | ICD-10-CM | POA: Diagnosis not present

## 2020-10-30 DIAGNOSIS — O24414 Gestational diabetes mellitus in pregnancy, insulin controlled: Secondary | ICD-10-CM | POA: Diagnosis not present

## 2020-10-30 DIAGNOSIS — O0993 Supervision of high risk pregnancy, unspecified, third trimester: Secondary | ICD-10-CM | POA: Diagnosis not present

## 2020-10-30 DIAGNOSIS — Z3A28 28 weeks gestation of pregnancy: Secondary | ICD-10-CM | POA: Diagnosis not present

## 2020-11-14 DIAGNOSIS — O24414 Gestational diabetes mellitus in pregnancy, insulin controlled: Secondary | ICD-10-CM | POA: Diagnosis not present

## 2020-11-14 DIAGNOSIS — O10913 Unspecified pre-existing hypertension complicating pregnancy, third trimester: Secondary | ICD-10-CM | POA: Diagnosis not present

## 2020-11-14 DIAGNOSIS — K219 Gastro-esophageal reflux disease without esophagitis: Secondary | ICD-10-CM | POA: Diagnosis not present

## 2020-11-14 DIAGNOSIS — O0993 Supervision of high risk pregnancy, unspecified, third trimester: Secondary | ICD-10-CM | POA: Diagnosis not present

## 2020-11-20 ENCOUNTER — Other Ambulatory Visit: Payer: Self-pay | Admitting: Obstetrics and Gynecology

## 2020-11-20 DIAGNOSIS — O24414 Gestational diabetes mellitus in pregnancy, insulin controlled: Secondary | ICD-10-CM

## 2020-11-20 DIAGNOSIS — O10919 Unspecified pre-existing hypertension complicating pregnancy, unspecified trimester: Secondary | ICD-10-CM

## 2020-11-20 DIAGNOSIS — O99213 Obesity complicating pregnancy, third trimester: Secondary | ICD-10-CM

## 2020-11-27 ENCOUNTER — Other Ambulatory Visit: Payer: Self-pay

## 2020-11-28 DIAGNOSIS — O0992 Supervision of high risk pregnancy, unspecified, second trimester: Secondary | ICD-10-CM | POA: Diagnosis not present

## 2020-11-28 DIAGNOSIS — O10912 Unspecified pre-existing hypertension complicating pregnancy, second trimester: Secondary | ICD-10-CM | POA: Diagnosis not present

## 2020-11-28 DIAGNOSIS — O99212 Obesity complicating pregnancy, second trimester: Secondary | ICD-10-CM | POA: Diagnosis not present

## 2020-11-28 DIAGNOSIS — O24414 Gestational diabetes mellitus in pregnancy, insulin controlled: Secondary | ICD-10-CM | POA: Diagnosis not present

## 2020-12-01 DIAGNOSIS — O0992 Supervision of high risk pregnancy, unspecified, second trimester: Secondary | ICD-10-CM | POA: Diagnosis not present

## 2020-12-01 DIAGNOSIS — O24414 Gestational diabetes mellitus in pregnancy, insulin controlled: Secondary | ICD-10-CM | POA: Diagnosis not present

## 2020-12-02 ENCOUNTER — Ambulatory Visit: Payer: Medicaid Other | Attending: Maternal & Fetal Medicine

## 2020-12-02 ENCOUNTER — Other Ambulatory Visit: Payer: Self-pay

## 2020-12-02 DIAGNOSIS — O24414 Gestational diabetes mellitus in pregnancy, insulin controlled: Secondary | ICD-10-CM | POA: Diagnosis not present

## 2020-12-02 DIAGNOSIS — O10919 Unspecified pre-existing hypertension complicating pregnancy, unspecified trimester: Secondary | ICD-10-CM

## 2020-12-02 DIAGNOSIS — O10019 Pre-existing essential hypertension complicating pregnancy, unspecified trimester: Secondary | ICD-10-CM | POA: Diagnosis present

## 2020-12-02 DIAGNOSIS — Z3A33 33 weeks gestation of pregnancy: Secondary | ICD-10-CM | POA: Diagnosis not present

## 2020-12-02 DIAGNOSIS — O99213 Obesity complicating pregnancy, third trimester: Secondary | ICD-10-CM

## 2020-12-02 DIAGNOSIS — O10913 Unspecified pre-existing hypertension complicating pregnancy, third trimester: Secondary | ICD-10-CM | POA: Diagnosis not present

## 2020-12-02 DIAGNOSIS — O10013 Pre-existing essential hypertension complicating pregnancy, third trimester: Secondary | ICD-10-CM | POA: Diagnosis not present

## 2020-12-04 ENCOUNTER — Other Ambulatory Visit: Payer: Self-pay

## 2020-12-04 ENCOUNTER — Encounter: Payer: Self-pay | Admitting: Obstetrics and Gynecology

## 2020-12-04 ENCOUNTER — Observation Stay
Admission: EM | Admit: 2020-12-04 | Discharge: 2020-12-04 | Disposition: A | Discharge status: Home / Self Care | Payer: Medicaid Other

## 2020-12-04 DIAGNOSIS — Z87891 Personal history of nicotine dependence: Secondary | ICD-10-CM | POA: Insufficient documentation

## 2020-12-04 DIAGNOSIS — O0993 Supervision of high risk pregnancy, unspecified, third trimester: Principal | ICD-10-CM | POA: Insufficient documentation

## 2020-12-04 DIAGNOSIS — O24414 Gestational diabetes mellitus in pregnancy, insulin controlled: Secondary | ICD-10-CM | POA: Diagnosis not present

## 2020-12-04 DIAGNOSIS — Z3A33 33 weeks gestation of pregnancy: Secondary | ICD-10-CM | POA: Diagnosis not present

## 2020-12-04 DIAGNOSIS — O368330 Maternal care for abnormalities of the fetal heart rate or rhythm, third trimester, not applicable or unspecified: Secondary | ICD-10-CM | POA: Diagnosis not present

## 2020-12-04 DIAGNOSIS — Z7982 Long term (current) use of aspirin: Secondary | ICD-10-CM | POA: Diagnosis not present

## 2020-12-04 DIAGNOSIS — Z79899 Other long term (current) drug therapy: Secondary | ICD-10-CM | POA: Diagnosis not present

## 2020-12-04 DIAGNOSIS — O10013 Pre-existing essential hypertension complicating pregnancy, third trimester: Secondary | ICD-10-CM | POA: Insufficient documentation

## 2020-12-04 DIAGNOSIS — O10913 Unspecified pre-existing hypertension complicating pregnancy, third trimester: Secondary | ICD-10-CM | POA: Diagnosis not present

## 2020-12-04 DIAGNOSIS — O288 Other abnormal findings on antenatal screening of mother: Secondary | ICD-10-CM | POA: Diagnosis present

## 2020-12-04 NOTE — Discharge Summary (Signed)
Angelica Fisher is a 30 y.o. female. She is at [redacted]w[redacted]d gestation. Patient's last menstrual period was 04/14/2020 (exact date). Estimated Date of Delivery: 01/19/21    Prenatal care site: [facility]   Chief Complaint: high risk pregnancy, need for antepartum surveillance   HPI: sent to L&D for nonreactive NST in office.  Unable to get continuous tracing.   S: Resting comfortably. no CTX, no VB.no LOF,  Active fetal movement.    Maternal Medical History:  Past Medical Hx:  has a past medical history of Gestational diabetes and Hypertension.  Past Surgical Hx:  has a past surgical history that includes Tonsillectomy; Wisdom tooth extraction; and Cesarean section.   No Known Allergies  Prior to Admission medications   Medication Sig Start Date End Date Taking? Authorizing Provider  ASPIRIN LOW DOSE 81 MG EC tablet Take 81 mg by mouth daily. 09/18/20  Yes [provider]  famotidine (PEPCID) 20 MG tablet Take 20 mg by mouth in the morning and at bedtime.   Yes [provider]  insulin NPH Human (NOVOLIN N) 100 UNIT/ML injection 38 AM 44 PM 08/21/20  Yes [provider]  insulin regular (NOVOLIN R) 100 units/mL injection 22 AM 16 PM 08/21/20  Yes [provider]  labetalol (NORMODYNE) 100 MG tablet Take 4 tablets by mouth in am (400mg ) and 2 tablets by mouth in pm (200mg ) 08/05/20  Yes [provider]  Prenat-FeAsp-Meth-FA-DHA w/o A (PRENATE PIXIE) 10-0.6-0.4-200 MG CAPS Take 1 capsule by mouth daily. 09/29/20  Yes [provider]  Insulin Syringe-Needle U-100 (INSULIN SYRINGE 1CC/31GX5/16") 31G X 5/16" 1 ML MISC See admin instructions. 08/21/20 08/21/21  [provider]  Prenat-FeAsp-Meth-FA-DHA w/o A (PRENATE PIXIE) 10-0.6-0.4-200 MG CAPS Take 1 tablet by mouth daily. 08/28/20   [provider]     Social History: She  reports that she quit smoking about 6 months ago. Her smoking use included cigarettes. She has a  8.50 pack-year smoking history. She has never used smokeless tobacco. She reports previous alcohol use. She reports that she does not use drugs.  Family History: Family hx noncontributory, no history of gyn cancers  Review of Systems: A full review of systems was performed and negative except as noted in the HPI.     O:  BP 126/64   Pulse 70   Temp 98.1 F (36.7 C) (Oral)   Resp 17   Ht 5' 8.5" (1.74 m)   Wt 126.6 kg   LMP 04/14/2020 (Exact Date) Comment: normal period  BMI 41.80 kg/m  No results found for this or any previous visit (from the past 48 hour(s)).   Constitutional: NAD, AAOx3  HE/ENT: extraocular movements grossly intact, moist mucous membranes CV: RRR PULM: nl respiratory effort, CTABL     Abd: gravid, non-tender, non-distended, soft      Ext: Non-tender, Nonedmeatous   Psych: mood appropriate, speech normal Pelvic: deferred   NST: Baseline: 135 Variability: moderate Accelerations present x >2 Decelerations absent Time 08/30/20   A/P: 30 y.o. [redacted]w[redacted]d with high risk pregnancy and antepartum surveillance.   Labor: not present.   Fetal Wellbeing: Reassuring Cat 1 tracing.  Reactive NST   D/c home stable, precautions reviewed, follow-up as scheduled.   ----- 26, CNM Certified Nurse Midwife [facility]

## 2020-12-04 NOTE — OB Triage Note (Signed)
Pt sent from office for fetal monitoring d/t difficult tracing in the office

## 2020-12-04 NOTE — Discharge Instructions (Signed)
Fetal Movement Counts Patient Name: ________________________________________________ Patient Due Date: ____________________  What is a fetal movement count? A fetal movement count is the number of times that you feel your baby move during a certain amount of time. This may also be called a fetal kick count. A fetal movement count is recommended for every pregnant woman. You may be asked to start counting fetal movements as early as week 28 of your pregnancy. Pay attention to when your baby is most active. You may notice your baby's sleep and wake cycles. You may also notice things that make your baby move more. You should do a fetal movement count:  When your baby is normally most active.  At the same time each day. A good time to count movements is while you are resting, after having something to eat and drink. How do I count fetal movements? 1. Find a quiet, comfortable area. Sit, or lie down on your side. 2. Write down the date, the start time and stop time, and the number of movements that you felt between those two times. Take this information with you to your health care visits. 3. Write down your start time when you feel the first movement. 4. Count kicks, flutters, swishes, rolls, and jabs. You should feel at least 10 movements. 5. You may stop counting after you have felt 10 movements, or if you have been counting for 2 hours. Write down the stop time. 6. If you do not feel 10 movements in 2 hours, contact your health care provider for further instructions. Your health care provider may want to do additional tests to assess your baby's well-being. Contact a health care provider if:  You feel fewer than 10 movements in 2 hours.  Your baby is not moving like he or she usually does. Date: ____________ Start time: ____________ Stop time: ____________ Movements: ____________ Date: ____________ Start time: ____________ Stop time: ____________ Movements: ____________ Date: ____________  Start time: ____________ Stop time: ____________ Movements: ____________ Date: ____________ Start time: ____________ Stop time: ____________ Movements: ____________ Date: ____________ Start time: ____________ Stop time: ____________ Movements: ____________ Date: ____________ Start time: ____________ Stop time: ____________ Movements: ____________ Date: ____________ Start time: ____________ Stop time: ____________ Movements: ____________ Date: ____________ Start time: ____________ Stop time: ____________ Movements: ____________ Date: ____________ Start time: ____________ Stop time: ____________ Movements: ____________ This information is not intended to replace advice given to you by your health care provider. Make sure you discuss any questions you have with your health care provider. Document Revised: 03/15/2019 Document Reviewed: 03/15/2019 Elsevier Patient Education  2021 Elsevier Inc.  

## 2020-12-04 NOTE — Progress Notes (Signed)
Patient discharged home, discharge instructions given, patient states understanding. Patient left floor in stable condition, denies any other needs at this time. Patient to keep next scheduled OB appointment 

## 2020-12-05 ENCOUNTER — Telehealth: Payer: Self-pay | Admitting: *Deleted

## 2020-12-05 NOTE — Telephone Encounter (Signed)
Transition Care Management Unsuccessful Follow-up Telephone Call  Date of discharge and from where:  12/04/2020 [facility]  Attempts:  1st Attempt  Reason for unsuccessful TCM follow-up call:  Left voice message

## 2020-12-08 DIAGNOSIS — O24414 Gestational diabetes mellitus in pregnancy, insulin controlled: Secondary | ICD-10-CM | POA: Diagnosis not present

## 2020-12-08 DIAGNOSIS — O10913 Unspecified pre-existing hypertension complicating pregnancy, third trimester: Secondary | ICD-10-CM | POA: Diagnosis not present

## 2020-12-08 DIAGNOSIS — O0993 Supervision of high risk pregnancy, unspecified, third trimester: Secondary | ICD-10-CM | POA: Diagnosis not present

## 2020-12-08 NOTE — Telephone Encounter (Signed)
Transition Care Management Follow-up Telephone Call  Date of discharge and from where: 12/04/2020 from [facility]  How have you been since you were released from the hospital? Pt stated that she is feeling good today.   Any questions or concerns? No  Items Reviewed:  Did the pt receive and understand the discharge instructions provided? Yes   Medications obtained and verified? Yes   Other? No   Any new allergies since your discharge? No   Dietary orders reviewed? n/a  Do you have support at home? Yes   Functional Questionnaire: (I = Independent and D = Dependent) ADLs: I  Bathing/Dressing- I  Meal Prep- I  Eating- I  Maintaining continence- I  Transferring/Ambulation- I  Managing Meds- I  Follow up appointments reviewed:   PCP Hospital f/u appt confirmed? No    Specialist Hospital f/u appt confirmed? Yes  OBGYN appts are scheduled through May.    Are transportation arrangements needed? No   If their condition worsens, is the pt aware to call PCP or go to the Emergency Dept.? Yes  Was the patient provided with contact information for the PCP's office or ED? Yes  Was to pt encouraged to call back with questions or concerns? Yes

## 2020-12-11 DIAGNOSIS — N898 Other specified noninflammatory disorders of vagina: Secondary | ICD-10-CM | POA: Diagnosis not present

## 2020-12-11 DIAGNOSIS — Z113 Encounter for screening for infections with a predominantly sexual mode of transmission: Secondary | ICD-10-CM | POA: Diagnosis not present

## 2020-12-11 DIAGNOSIS — O26893 Other specified pregnancy related conditions, third trimester: Secondary | ICD-10-CM | POA: Diagnosis not present

## 2020-12-11 DIAGNOSIS — O24419 Gestational diabetes mellitus in pregnancy, unspecified control: Secondary | ICD-10-CM | POA: Diagnosis not present

## 2020-12-15 DIAGNOSIS — O0993 Supervision of high risk pregnancy, unspecified, third trimester: Secondary | ICD-10-CM | POA: Diagnosis not present

## 2020-12-15 DIAGNOSIS — O24414 Gestational diabetes mellitus in pregnancy, insulin controlled: Secondary | ICD-10-CM | POA: Diagnosis not present

## 2020-12-15 DIAGNOSIS — O10919 Unspecified pre-existing hypertension complicating pregnancy, unspecified trimester: Secondary | ICD-10-CM | POA: Diagnosis not present

## 2020-12-15 DIAGNOSIS — O99213 Obesity complicating pregnancy, third trimester: Secondary | ICD-10-CM | POA: Diagnosis not present

## 2020-12-19 ENCOUNTER — Encounter: Payer: Self-pay | Admitting: Obstetrics and Gynecology

## 2020-12-19 ENCOUNTER — Other Ambulatory Visit: Payer: Self-pay

## 2020-12-19 ENCOUNTER — Observation Stay: Payer: Medicaid Other

## 2020-12-19 ENCOUNTER — Observation Stay
Admission: EM | Admit: 2020-12-19 | Discharge: 2020-12-20 | Disposition: A | Discharge status: Home / Self Care | Payer: Medicaid Other | Attending: Certified Nurse Midwife | Admitting: Certified Nurse Midwife

## 2020-12-19 DIAGNOSIS — O24113 Pre-existing diabetes mellitus, type 2, in pregnancy, third trimester: Secondary | ICD-10-CM | POA: Diagnosis not present

## 2020-12-19 DIAGNOSIS — Z3A35 35 weeks gestation of pregnancy: Secondary | ICD-10-CM | POA: Insufficient documentation

## 2020-12-19 DIAGNOSIS — Z6837 Body mass index (BMI) 37.0-37.9, adult: Secondary | ICD-10-CM | POA: Diagnosis not present

## 2020-12-19 DIAGNOSIS — Z113 Encounter for screening for infections with a predominantly sexual mode of transmission: Secondary | ICD-10-CM | POA: Diagnosis not present

## 2020-12-19 DIAGNOSIS — Z8616 Personal history of COVID-19: Secondary | ICD-10-CM | POA: Insufficient documentation

## 2020-12-19 DIAGNOSIS — O10013 Pre-existing essential hypertension complicating pregnancy, third trimester: Secondary | ICD-10-CM | POA: Diagnosis not present

## 2020-12-19 DIAGNOSIS — O24419 Gestational diabetes mellitus in pregnancy, unspecified control: Principal | ICD-10-CM | POA: Insufficient documentation

## 2020-12-19 DIAGNOSIS — O288 Other abnormal findings on antenatal screening of mother: Secondary | ICD-10-CM | POA: Diagnosis present

## 2020-12-19 DIAGNOSIS — Z114 Encounter for screening for human immunodeficiency virus [HIV]: Secondary | ICD-10-CM | POA: Diagnosis not present

## 2020-12-19 DIAGNOSIS — O24414 Gestational diabetes mellitus in pregnancy, insulin controlled: Secondary | ICD-10-CM | POA: Diagnosis not present

## 2020-12-19 DIAGNOSIS — O10919 Unspecified pre-existing hypertension complicating pregnancy, unspecified trimester: Secondary | ICD-10-CM

## 2020-12-19 DIAGNOSIS — O0993 Supervision of high risk pregnancy, unspecified, third trimester: Secondary | ICD-10-CM | POA: Diagnosis not present

## 2020-12-19 DIAGNOSIS — O99213 Obesity complicating pregnancy, third trimester: Secondary | ICD-10-CM | POA: Insufficient documentation

## 2020-12-19 DIAGNOSIS — O133 Gestational [pregnancy-induced] hypertension without significant proteinuria, third trimester: Secondary | ICD-10-CM | POA: Diagnosis not present

## 2020-12-19 DIAGNOSIS — E669 Obesity, unspecified: Secondary | ICD-10-CM | POA: Insufficient documentation

## 2020-12-19 LAB — OB RESULTS CONSOLE GC/CHLAMYDIA
Chlamydia: NEGATIVE
Gonorrhea: NEGATIVE

## 2020-12-19 LAB — OB RESULTS CONSOLE GBS: GBS: POSITIVE

## 2020-12-19 LAB — OB RESULTS CONSOLE HIV ANTIBODY (ROUTINE TESTING): HIV: NONREACTIVE

## 2020-12-19 LAB — OB RESULTS CONSOLE RPR: RPR: NONREACTIVE

## 2020-12-19 MED ORDER — INSULIN ASPART 100 UNIT/ML IJ SOLN
18.0000 [IU] | Freq: Once | INTRAMUSCULAR | Status: AC
  Administered 2020-12-20: 18 [IU] via SUBCUTANEOUS
  Filled 2020-12-19: qty 1

## 2020-12-19 MED ORDER — LABETALOL HCL 100 MG PO TABS
200.0000 mg | ORAL_TABLET | Freq: Once | ORAL | Status: AC
  Administered 2020-12-20: 200 mg via ORAL
  Filled 2020-12-19: qty 2

## 2020-12-19 MED ORDER — SODIUM CHLORIDE (PF) 0.9 % IJ SOLN
INTRAMUSCULAR | Status: AC
  Filled 2020-12-19: qty 50

## 2020-12-19 MED ORDER — BUPIVACAINE LIPOSOME 1.3 % IJ SUSP
INTRAMUSCULAR | Status: AC
  Filled 2020-12-19: qty 20

## 2020-12-19 MED ORDER — INSULIN NPH (HUMAN) (ISOPHANE) 100 UNIT/ML ~~LOC~~ SUSP: 62.0000 [IU] | Freq: Every day | SUBCUTANEOUS | Status: DC

## 2020-12-19 MED ORDER — INSULIN REGULAR HUMAN 100 UNIT/ML IJ SOLN: 18.0000 [IU] | Freq: Once | INTRAMUSCULAR | Status: DC

## 2020-12-19 MED ORDER — LACTATED RINGERS IV BOLUS
500.0000 mL | Freq: Once | INTRAVENOUS | Status: AC
  Administered 2020-12-19: 500 mL via INTRAVENOUS

## 2020-12-19 MED ORDER — LACTATED RINGERS IV SOLN: INTRAVENOUS | Status: DC

## 2020-12-19 MED ORDER — LABETALOL HCL 100 MG PO TABS
400.0000 mg | ORAL_TABLET | Freq: Once | ORAL | Status: AC
  Administered 2020-12-20: 400 mg via ORAL
  Filled 2020-12-19: qty 4

## 2020-12-19 MED ORDER — OXYTOCIN-SODIUM CHLORIDE 30-0.9 UT/500ML-% IV SOLN
INTRAVENOUS | Status: AC
  Filled 2020-12-19: qty 500

## 2020-12-19 MED ORDER — METHYLERGONOVINE MALEATE 0.2 MG/ML IJ SOLN
INTRAMUSCULAR | Status: AC
  Filled 2020-12-19: qty 1

## 2020-12-19 MED ORDER — INSULIN DETEMIR 100 UNIT/ML ~~LOC~~ SOLN
62.0000 [IU] | Freq: Every day | SUBCUTANEOUS | Status: DC
  Filled 2020-12-19: qty 0.62

## 2020-12-19 MED ORDER — BUPIVACAINE HCL (PF) 0.5 % IJ SOLN
INTRAMUSCULAR | Status: AC
  Filled 2020-12-19: qty 30

## 2020-12-19 MED ORDER — CARBOPROST TROMETHAMINE 250 MCG/ML IM SOLN
INTRAMUSCULAR | Status: AC
  Filled 2020-12-19: qty 1

## 2020-12-19 MED ORDER — INSULIN DETEMIR 100 UNIT/ML ~~LOC~~ SOLN
58.0000 [IU] | Freq: Every day | SUBCUTANEOUS | Status: DC
  Administered 2020-12-20: 58 [IU] via SUBCUTANEOUS
  Filled 2020-12-19 (×2): qty 0.58

## 2020-12-19 NOTE — OB Triage Note (Signed)
Pt sent from office for NSt d/t difficult tracing in the office

## 2020-12-19 NOTE — Progress Notes (Signed)
Notified provider that BPP has been completed by U/s and [place], CNM stated she would come in and evaluate.

## 2020-12-19 NOTE — H&P (Signed)
Angelica Fisher is a 30 y.o. female presenting forelective repeat LTCS  + BTL  37+4 weeks   Insulin dep GDM  And CHTN on Labetalol   OB History    Gravida  3   Para  1   Term  1   Preterm  0   AB  1   Living  1     SAB  0   IAB  1   Ectopic      Multiple      Live Births             Past Medical History:  Diagnosis Date  . Gestational diabetes   . Hypertension    Past Surgical History:  Procedure Laterality Date  . CESAREAN SECTION     07/09/2010  . TONSILLECTOMY     age 89  . WISDOM TOOTH EXTRACTION     age 81   Family History: family history is not on file. Social History:  reports that she quit smoking about 7 months ago. Her smoking use included cigarettes. She has a 8.50 pack-year smoking history. She has never used smokeless tobacco. She reports previous alcohol use. She reports that she does not use drugs.     Maternal Diabetes: Yes:  Diabetes Type:  Insulin/Medication controlled Genetic Screening: Normal Maternal Ultrasounds/Referrals: Normal Fetal Ultrasounds or other Referrals:  Fetal echo Maternal Substance Abuse:  No Significant Maternal Medications:  Meds include: Other: insulin + labetalol Significant Maternal Lab Results:  Other:  pending  Other Comments:  None  Review of Systems History   Last menstrual period 04/14/2020. Exam Physical Exam  Lungs CTA   CV: RRR abd  Gravid   Prenatal labs: ABO, Rh:  O+ Antibody:  neg Rubella:  Imm / Varicella : Imm RPR:   nr HBsAg:   neg HIV:   Neg  GBS:   pending   Assessment/Plan: Elective repeat LTCS and BTL  37+4 week because of ID GDM and CHTN on meds   Risks of the procedure discussed , please see [facility] notes  Ihor Austin Jermale Crass 12/19/2020, 12:09 PM
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
## 2020-12-20 DIAGNOSIS — O368330 Maternal care for abnormalities of the fetal heart rate or rhythm, third trimester, not applicable or unspecified: Secondary | ICD-10-CM | POA: Diagnosis not present

## 2020-12-20 DIAGNOSIS — O10013 Pre-existing essential hypertension complicating pregnancy, third trimester: Secondary | ICD-10-CM | POA: Diagnosis not present

## 2020-12-20 DIAGNOSIS — O10913 Unspecified pre-existing hypertension complicating pregnancy, third trimester: Secondary | ICD-10-CM | POA: Diagnosis not present

## 2020-12-20 DIAGNOSIS — Z8616 Personal history of COVID-19: Secondary | ICD-10-CM | POA: Diagnosis not present

## 2020-12-20 DIAGNOSIS — Z3A35 35 weeks gestation of pregnancy: Secondary | ICD-10-CM | POA: Diagnosis not present

## 2020-12-20 DIAGNOSIS — O24419 Gestational diabetes mellitus in pregnancy, unspecified control: Secondary | ICD-10-CM | POA: Diagnosis not present

## 2020-12-20 DIAGNOSIS — O98513 Other viral diseases complicating pregnancy, third trimester: Secondary | ICD-10-CM | POA: Diagnosis not present

## 2020-12-20 DIAGNOSIS — Z6837 Body mass index (BMI) 37.0-37.9, adult: Secondary | ICD-10-CM | POA: Diagnosis not present

## 2020-12-20 DIAGNOSIS — O24414 Gestational diabetes mellitus in pregnancy, insulin controlled: Secondary | ICD-10-CM | POA: Diagnosis not present

## 2020-12-20 DIAGNOSIS — E669 Obesity, unspecified: Secondary | ICD-10-CM | POA: Diagnosis not present

## 2020-12-20 DIAGNOSIS — O99213 Obesity complicating pregnancy, third trimester: Secondary | ICD-10-CM | POA: Diagnosis not present

## 2020-12-20 LAB — GLUCOSE, CAPILLARY
Glucose-Capillary: 126 mg/dL — ABNORMAL HIGH (ref 70–99)
Glucose-Capillary: 86 mg/dL (ref 70–99)

## 2020-12-20 NOTE — Progress Notes (Signed)
RN and CNM went over discharge instructions with patient and reviewed follow up care for NST and AFI check on Monday 5/16 at 1500. Patient eager for discharge and left in stable condition. Patient discharged via wheelchair.

## 2020-12-22 ENCOUNTER — Inpatient Hospital Stay
Admission: EM | Admit: 2020-12-22 | Discharge: 2020-12-22 | Disposition: A | Discharge status: Home / Self Care | Payer: Medicaid Other | Attending: Obstetrics and Gynecology | Admitting: Obstetrics and Gynecology

## 2020-12-22 ENCOUNTER — Encounter: Payer: Self-pay | Admitting: Obstetrics and Gynecology

## 2020-12-22 ENCOUNTER — Inpatient Hospital Stay: Payer: Medicaid Other

## 2020-12-22 ENCOUNTER — Other Ambulatory Visit: Payer: Self-pay | Admitting: Obstetrics and Gynecology

## 2020-12-22 ENCOUNTER — Telehealth: Payer: Self-pay

## 2020-12-22 ENCOUNTER — Other Ambulatory Visit: Payer: Self-pay

## 2020-12-22 DIAGNOSIS — O133 Gestational [pregnancy-induced] hypertension without significant proteinuria, third trimester: Secondary | ICD-10-CM | POA: Insufficient documentation

## 2020-12-22 DIAGNOSIS — E669 Obesity, unspecified: Secondary | ICD-10-CM | POA: Insufficient documentation

## 2020-12-22 DIAGNOSIS — Z3A35 35 weeks gestation of pregnancy: Secondary | ICD-10-CM | POA: Insufficient documentation

## 2020-12-22 DIAGNOSIS — Z6837 Body mass index (BMI) 37.0-37.9, adult: Secondary | ICD-10-CM | POA: Insufficient documentation

## 2020-12-22 DIAGNOSIS — O24419 Gestational diabetes mellitus in pregnancy, unspecified control: Secondary | ICD-10-CM

## 2020-12-22 DIAGNOSIS — O368330 Maternal care for abnormalities of the fetal heart rate or rhythm, third trimester, not applicable or unspecified: Secondary | ICD-10-CM | POA: Diagnosis not present

## 2020-12-22 DIAGNOSIS — O99213 Obesity complicating pregnancy, third trimester: Secondary | ICD-10-CM

## 2020-12-22 DIAGNOSIS — O4103X0 Oligohydramnios, third trimester, not applicable or unspecified: Secondary | ICD-10-CM

## 2020-12-22 DIAGNOSIS — Z8616 Personal history of COVID-19: Secondary | ICD-10-CM | POA: Insufficient documentation

## 2020-12-22 DIAGNOSIS — O10913 Unspecified pre-existing hypertension complicating pregnancy, third trimester: Secondary | ICD-10-CM
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
## 2020-12-22 NOTE — Telephone Encounter (Signed)
Transition Care Management Follow-up Telephone Call  Date of discharge and from where: 12/20/2020 from [facility]  How have you been since you were released from the hospital? Pt stated that she is feeling somewhat better today. Pt did not have any questions or concerns.   Any questions or concerns? No  Items Reviewed:  Did the pt receive and understand the discharge instructions provided? Yes   Medications obtained and verified? Yes   Other? No   Any new allergies since your discharge? No   Dietary orders reviewed? Dm diet  Do you have support at home? Yes   Functional Questionnaire: (I = Independent and D = Dependent) ADLs: I  Bathing/Dressing- I  Meal Prep- I  Eating- I  Maintaining continence- I  Transferring/Ambulation- I  Managing Meds- I   Follow up appointments reviewed:   PCP Hospital f/u appt confirmed? No   Specialist Hospital f/u appt confirmed? Yes  Scheduled to see OBGYN this afternoon.  Are transportation arrangements needed? No   If their condition worsens, is the pt aware to call PCP or go to the Emergency Dept.? Yes  Was the patient provided with contact information for the PCP's office or ED? Yes  Was to pt encouraged to call back with questions or concerns? Yes

## 2020-12-22 NOTE — Progress Notes (Signed)
Patient to ultrasound via wheelchair.

## 2020-12-22 NOTE — Progress Notes (Signed)
Patient discharged home, discharge instructions given, patient states understanding. Patient left floor in stable condition, denies any other needs at this time. Patient to keep next scheduled OB appointment and return to [facility] for NST and AFI

## 2020-12-22 NOTE — Discharge Instructions (Signed)
Keep your next scheduled OB appointment and return to [facility] 12/24/2020 at 4pm for NST and AFI. Call your provider for any other concerns.

## 2020-12-24 ENCOUNTER — Observation Stay: Payer: Medicaid Other

## 2020-12-24 ENCOUNTER — Other Ambulatory Visit: Payer: Self-pay

## 2020-12-24 ENCOUNTER — Observation Stay
Admission: RE | Admit: 2020-12-24 | Discharge: 2020-12-24 | Disposition: A | Discharge status: Home / Self Care | Payer: Medicaid Other | Attending: Obstetrics and Gynecology | Admitting: Obstetrics and Gynecology

## 2020-12-24 ENCOUNTER — Encounter: Payer: Self-pay | Admitting: Obstetrics and Gynecology

## 2020-12-24 DIAGNOSIS — E669 Obesity, unspecified: Secondary | ICD-10-CM | POA: Insufficient documentation

## 2020-12-24 DIAGNOSIS — O24414 Gestational diabetes mellitus in pregnancy, insulin controlled: Secondary | ICD-10-CM | POA: Insufficient documentation

## 2020-12-24 DIAGNOSIS — O368331 Maternal care for abnormalities of the fetal heart rate or rhythm, third trimester, fetus 1: Secondary | ICD-10-CM | POA: Diagnosis not present

## 2020-12-24 DIAGNOSIS — Z8616 Personal history of COVID-19: Secondary | ICD-10-CM | POA: Diagnosis not present

## 2020-12-24 DIAGNOSIS — O99213 Obesity complicating pregnancy, third trimester: Secondary | ICD-10-CM | POA: Insufficient documentation

## 2020-12-24 DIAGNOSIS — Z79899 Other long term (current) drug therapy: Secondary | ICD-10-CM | POA: Diagnosis not present

## 2020-12-24 DIAGNOSIS — Z794 Long term (current) use of insulin: Secondary | ICD-10-CM | POA: Insufficient documentation

## 2020-12-24 DIAGNOSIS — O10919 Unspecified pre-existing hypertension complicating pregnancy, unspecified trimester: Secondary | ICD-10-CM | POA: Diagnosis present

## 2020-12-24 DIAGNOSIS — Z3A36 36 weeks gestation of pregnancy: Secondary | ICD-10-CM | POA: Diagnosis not present

## 2020-12-24 DIAGNOSIS — O4103X0 Oligohydramnios, third trimester, not applicable or unspecified: Secondary | ICD-10-CM

## 2020-12-24 DIAGNOSIS — Z3A35 35 weeks gestation of pregnancy: Secondary | ICD-10-CM | POA: Diagnosis not present

## 2020-12-24 DIAGNOSIS — O98513 Other viral diseases complicating pregnancy, third trimester: Secondary | ICD-10-CM | POA: Diagnosis not present

## 2020-12-24 DIAGNOSIS — O4103X5 Oligohydramnios, third trimester, fetus 5: Secondary | ICD-10-CM | POA: Diagnosis not present

## 2020-12-24 DIAGNOSIS — O10913 Unspecified pre-existing hypertension complicating pregnancy, third trimester: Secondary | ICD-10-CM | POA: Diagnosis not present

## 2020-12-24 DIAGNOSIS — O133 Gestational [pregnancy-induced] hypertension without significant proteinuria, third trimester: Secondary | ICD-10-CM | POA: Diagnosis not present

## 2020-12-24 NOTE — OB Triage Note (Signed)
Patient is discharged home, [place] reviewed by CNM. Reviewed discharge instructions and patient verbalizes understanding. Patient ambulatory, in good condition, home by self in personal vehicle.
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
## 2020-12-24 NOTE — OB Triage Note (Signed)
Patient is G3P1, [redacted]w[redacted]d that presents for scheduled NST. +FM. No bleeding or LOF per patient. VSS. Monitors applied and assessing.

## 2020-12-25 ENCOUNTER — Encounter
Admission: RE | Admit: 2020-12-25 | Discharge: 2020-12-25 | Disposition: A | Discharge status: Home / Self Care | Payer: Medicaid Other | Source: Ambulatory Visit | Attending: Obstetrics and Gynecology | Admitting: Obstetrics and Gynecology

## 2020-12-25 ENCOUNTER — Telehealth: Payer: Self-pay | Admitting: *Deleted

## 2020-12-25 DIAGNOSIS — O24414 Gestational diabetes mellitus in pregnancy, insulin controlled: Secondary | ICD-10-CM | POA: Diagnosis not present

## 2020-12-25 DIAGNOSIS — O10913 Unspecified pre-existing hypertension complicating pregnancy, third trimester: Secondary | ICD-10-CM | POA: Diagnosis not present

## 2020-12-25 HISTORY — DX: Other specified postprocedural states: Z98.890

## 2020-12-25 HISTORY — DX: Nausea with vomiting, unspecified: R11.2

## 2020-12-25 HISTORY — DX: Gastro-esophageal reflux disease without esophagitis: K21.9

## 2020-12-25 HISTORY — DX: Personal history of urinary calculi: Z87.442

## 2020-12-25 NOTE — Telephone Encounter (Signed)
Transition Care Management Follow-up Telephone Call  Date of discharge and from where: 12/24/2020 [facility]  How have you been since you were released from the hospital? "I am okay"  Any questions or concerns? No  Items Reviewed:  Did the pt receive and understand the discharge instructions provided? Yes   Medications obtained and verified? N/A  Other? No   Any new allergies since your discharge? No  Dietary orders reviewed? No  Do you have support at home? Yes    Functional Questionnaire: (I = Independent and D = Dependent) ADLs: I  Bathing/Dressing- I  Meal Prep- I  Eating- I  Maintaining continence- I  Transferring/Ambulation- I  Managing Meds- I  Follow up appointments reviewed:   PCP Hospital f/u appt confirmed? No    Specialist Hospital f/u appt confirmed? No  - has pending OB appointments (provider is not a [facility] provider)  Are transportation arrangements needed? No   If their condition worsens, is the pt aware to call PCP or go to the Emergency Dept.? Yes  Was the patient provided with contact information for the PCP's office or ED? Yes  Was to pt encouraged to call back with questions or concerns? Yes

## 2020-12-25 NOTE — Patient Instructions (Addendum)
Your procedure is scheduled on: Friday 01/02/21.  Arrival Time: Please call Labor and Delivery the day before your scheduled C-Section to find out your arrival time. [phone].  Arrival: Arrive to the [facility] If your arrival time is prior to 6:00am, please call Labor and Delivery [phone] from your cell phone upon arrival and someone from L&D or security will come down to the Medical Mall entrance and escort you to Labor and Delivery. If your arrival time is 6:00am or later, please enter the Medical Mall and follow the greeter's instructions.   Partner: ONE dedicated partner/guest is allowed to accompany you to Labor and Delivery and into the OR. You may have TWO dedicated visitors during the remainder of your stay on the Mother/Baby Unit. Your guests may leave and return during normal visiting hours, but will have to be re-screened with each entry to the Medical Mall. These two visitors are not allowed to switch out for a new guest at any point during your hospital stay.   Remember: Instructions that are not followed completely may result in serious medical risk, up to and including death, or upon the discretion of your surgeon and anesthesiologist your surgery may need to be rescheduled.     __X__ 1. Do not eat food after midnight the night before your procedure.                 No gum chewing or hard candies. You may drink SUGAR FREE clear liquids up to 2 hours                 before you are scheduled to arrive for your surgery- DO NOT drink clear                 liquids within 2 hours of the start of your surgery.                 __X__2.  On the morning of surgery brush your teeth with toothpaste and water, you may rinse your mouth with mouthwash if you wish.  Do not swallow any toothpaste or mouthwash.    __X__ 3.  No Alcohol for 24 hours before or after surgery.  __X__ 4.  Do Not Smoke or use e-cigarettes For 24 Hours Prior to Your Surgery.                 Do not use any  chewable tobacco products for at least 6 hours prior to                 surgery.  __X__5.  Notify your doctor if there is any change in your medical condition      (cold, fever, infections).      Do NOT wear jewelry, make-up, hairpins, clips or nail polish. Do NOT wear lotions, powders, or perfumes.  Do NOT shave 48 hours prior to surgery. Men may shave face and neck. Do NOT bring valuables to the hospital.     [facility] is not responsible for any belongings or valuables.   Contacts, dentures/partials or body piercings may not be worn into surgery. Bring a case for your contacts, glasses or hearing aids, a denture cup will be supplied.     __X__ Take these medicines the morning of surgery with A SIP OF WATER:     1. labetalol (NORMODYNE)      __X__ Use CHG Soap as directed.   __X__ Take 1/2 the usual dose of insulin the night before your  surgery. No insulin the morning of your surgery.   __X__ Stop Aspirin 5 days prior to your procedure. Your last dose will be on Saturday 12/27/20.   __X__ Stop Anti-inflammatories 7 days before surgery such as Advil, Ibuprofen, Motrin, BC or Goodies Powder, Naprosyn, Naproxen, Aleve, Aspirin, Meloxicam. May take Tylenol if needed for pain or discomfort.   __X__Do not start taking any new herbal supplements or vitamins prior to your procedure.    Wear comfortable clothing (specific to your surgery type) to the hospital.  Plan for stool softeners for home use; pain medications have a tendency to cause constipation. You can also help prevent constipation by eating foods high in fiber such as fruits and vegetables and drinking plenty of fluids as your diet allows.  After surgery, you can prevent lung complications by doing breathing exercises.Take deep breaths and cough every 1-2 hours. Your doctor may order a device called an Incentive Spirometer to help you take deep breaths.  Please call the Pre-Admissions Testing Department at [phone] if you have any questions about these instructions.

## 2020-12-29 ENCOUNTER — Observation Stay
Admission: RE | Admit: 2020-12-29 | Discharge: 2020-12-29 | Disposition: A | Discharge status: Home / Self Care | Payer: Medicaid Other | Source: Ambulatory Visit | Attending: Obstetrics and Gynecology | Admitting: Obstetrics and Gynecology

## 2020-12-29 DIAGNOSIS — Z3683 Encounter for fetal screening for congenital cardiac abnormalities: Principal | ICD-10-CM | POA: Insufficient documentation

## 2020-12-29 DIAGNOSIS — O10919 Unspecified pre-existing hypertension complicating pregnancy, unspecified trimester: Secondary | ICD-10-CM | POA: Diagnosis present

## 2020-12-29 MED ORDER — ACETAMINOPHEN 500 MG PO TABS
ORAL_TABLET | ORAL | Status: AC
  Filled 2020-12-29: qty 2

## 2020-12-29 MED ORDER — ACETAMINOPHEN 500 MG PO TABS
1000.0000 mg | ORAL_TABLET | Freq: Once | ORAL | Status: AC
  Administered 2020-12-29: 1000 mg via ORAL

## 2020-12-29 NOTE — Discharge Summary (Signed)
Patient ID: Angelica Fisher MRN: [id number] DOB/AGE: [birth year] 30 y.o.  Admit date: 12/29/2020 Discharge date: 01/09/2021  Admission Diagnoses: Weekly NST C/o Headache   Discharge Diagnoses: NST reactive, headache resolved  Prenatal Procedures: NST  Consults: None  Significant Diagnostic Studies: none  Treatments: none  Hospital Course:  This is a 30 y.o. G3P1011 with IUP at [redacted]w[redacted]d seen for weekly NST.  She was deemed stable for discharge to home with outpatient follow up.  Discharge Physical Exam:  Temp 98.2 F (36.8 C) (Oral)   Resp 16   Ht 5\' 9"  (1.753 m)   Wt 127.5 kg   LMP 04/14/2020 (Exact Date) Comment: normal period  BMI 41.50 kg/m   General: NAD CV: RRR Pulm: CTABL, nl effort ABD: s/nd/nt, gravid DVT Evaluation: LE non-ttp, no evidence of DVT on exam.  NST: FHR baseline: 125 bpm Variability: moderate Accelerations: yes Decelerations: none Category/reactivity: reactive TOCO: quiet SVE: deferred      Discharge Condition: Stable  Disposition:  Discharge disposition: 01-Home or Self Care        Allergies as of 12/29/2020   No Known Allergies     Medication List    STOP taking these medications   magnesium oxide 400 MG tablet Commonly known as: MAG-OX   pantoprazole 40 MG tablet Commonly known as: PROTONIX     TAKE these medications   famotidine 10 MG tablet Commonly known as: PEPCID Take 20 mg by mouth in the morning and at bedtime.   Prenate Pixie 10-0.6-0.4-200 MG Caps Take 1 capsule by mouth daily.       Follow-up Information    [facility] LABOR AND DELIVERY Follow up on 12/31/2020.   Specialty: Obstetrics and Gynecology Why: For NST and AFI Contact information: [address] [place] [phone]              Signed:  [phone] 01/09/2021 10:51 PM

## 2020-12-30 ENCOUNTER — Ambulatory Visit: Payer: Medicaid Other

## 2020-12-30 ENCOUNTER — Telehealth: Payer: Self-pay

## 2020-12-30 NOTE — Telephone Encounter (Signed)
Transition Care Management Follow-up Telephone Call  Date of discharge and from where: 12/29/2020 from [facility]  How have you been since you were released from the hospital? Pt stated that she feeling well today and has no questions or concerns at this time.   Any questions or concerns? No  Items Reviewed:  Did the pt receive and understand the discharge instructions provided? Yes   Medications obtained and verified? Yes   Other? No   Any new allergies since your discharge? No   Dietary orders reviewed? n/a  Do you have support at home? Yes   Functional Questionnaire: (I = Independent and D = Dependent) ADLs: I  Bathing/Dressing- I  Meal Prep- I  Eating- I  Maintaining continence- I  Transferring/Ambulation- I  Managing Meds- I  Follow up appointments reviewed:   PCP Hospital f/u appt confirmed? No    Specialist Hospital f/u appt confirmed? Yes  OBGYN on 01/01/2021.   Are transportation arrangements needed? No   If their condition worsens, is the pt aware to call PCP or go to the Emergency Dept.? Yes  Was the patient provided with contact information for the PCP's office or ED? Yes  Was to pt encouraged to call back with questions or concerns? Yes

## 2020-12-31 ENCOUNTER — Encounter
Admission: RE | Admit: 2020-12-31 | Discharge: 2020-12-31 | Disposition: A | Discharge status: Home / Self Care | Payer: Medicaid Other | Source: Ambulatory Visit | Attending: Obstetrics and Gynecology | Admitting: Obstetrics and Gynecology

## 2020-12-31 ENCOUNTER — Other Ambulatory Visit: Payer: Self-pay

## 2020-12-31 ENCOUNTER — Observation Stay
Admission: RE | Admit: 2020-12-31 | Discharge: 2020-12-31 | Disposition: A | Discharge status: Home / Self Care | Payer: Medicaid Other | Source: Ambulatory Visit | Attending: Obstetrics and Gynecology | Admitting: Obstetrics and Gynecology

## 2020-12-31 ENCOUNTER — Encounter: Payer: Self-pay | Admitting: Obstetrics and Gynecology

## 2020-12-31 DIAGNOSIS — Z01812 Encounter for preprocedural laboratory examination: Secondary | ICD-10-CM | POA: Insufficient documentation

## 2020-12-31 DIAGNOSIS — R600 Localized edema: Secondary | ICD-10-CM | POA: Diagnosis not present

## 2020-12-31 DIAGNOSIS — O10919 Unspecified pre-existing hypertension complicating pregnancy, unspecified trimester: Secondary | ICD-10-CM | POA: Diagnosis present

## 2020-12-31 DIAGNOSIS — O368330 Maternal care for abnormalities of the fetal heart rate or rhythm, third trimester, not applicable or unspecified: Principal | ICD-10-CM | POA: Insufficient documentation

## 2020-12-31 DIAGNOSIS — I10 Essential (primary) hypertension: Secondary | ICD-10-CM | POA: Diagnosis not present

## 2020-12-31 DIAGNOSIS — Z3A37 37 weeks gestation of pregnancy: Secondary | ICD-10-CM | POA: Insufficient documentation

## 2020-12-31 DIAGNOSIS — Z20822 Contact with and (suspected) exposure to covid-19: Secondary | ICD-10-CM | POA: Diagnosis not present

## 2020-12-31 DIAGNOSIS — R0602 Shortness of breath: Secondary | ICD-10-CM | POA: Diagnosis not present

## 2020-12-31 LAB — BASIC METABOLIC PANEL
Anion gap: 9 (ref 5–15)
BUN: 12 mg/dL (ref 6–20)
CO2: 19 mmol/L — ABNORMAL LOW (ref 22–32)
Calcium: 8.6 mg/dL — ABNORMAL LOW (ref 8.9–10.3)
Chloride: 107 mmol/L (ref 98–111)
Creatinine, Ser: 0.57 mg/dL (ref 0.44–1.00)
GFR, Estimated: >60 mL/min (ref >60)
Glucose, Bld: 101 mg/dL — ABNORMAL HIGH (ref 70–99)
Potassium: 3.8 mmol/L (ref 3.5–5.1)
Sodium: 135 mmol/L (ref 135–145)

## 2020-12-31 LAB — TYPE AND SCREEN
ABO/RH(D): O POS
Antibody Screen: NEGATIVE
Extend sample reason: UNDETERMINED

## 2020-12-31 LAB — CBC
HCT: 38.4 % (ref 36.0–46.0)
Hemoglobin: 12.8 g/dL (ref 12.0–15.0)
MCH: 29 pg (ref 26.0–34.0)
MCHC: 33.3 g/dL (ref 30.0–36.0)
MCV: 86.9 fL (ref 80.0–100.0)
Platelets: 242 10*3/uL (ref 150–400)
RBC: 4.42 MIL/uL (ref 3.87–5.11)
RDW: 14 % (ref 11.5–15.5)
WBC: 11.7 10*3/uL — ABNORMAL HIGH (ref 4.0–10.5)
nRBC: 0 % (ref 0.0–0.2)

## 2020-12-31 LAB — SARS CORONAVIRUS 2 (TAT 6-24 HRS): SARS Coronavirus 2: NEGATIVE

## 2020-12-31 NOTE — Discharge Summary (Signed)
Patient ID: Angelica Fisher MRN: [id number] DOB/AGE: 09/25/1990 30 y.o.  Admit date: 12/31/2020 Discharge date: 01/13/2021  Admission Diagnoses: Weekly NST  Discharge Diagnoses: NST reactive  Prenatal Procedures: NST  Consults: None  Significant Diagnostic Studies:  No results found for this or any previous visit (from the past 168 hour(s)).  Treatments: none  Hospital Course:  This is a 30 y.o. [id number] with IUP at [redacted]w[redacted]d seen for weekly NST.  She was deemed stable for discharge to home with outpatient follow up.   Discharge Physical Exam:  BP 132/67 (BP Location: Left Arm)   Pulse 70   Temp 97.8 F (36.6 C) (Oral)   Resp 16   LMP 04/14/2020 (Exact Date) Comment: normal period  General: NAD CV: RRR Pulm: CTABL, nl effort ABD: s/nd/nt, gravid DVT Evaluation: LE non-ttp, no evidence of DVT on exam.  NST: FHR baseline: 125 bpm Variability: moderate Accelerations: yes Decelerations: none Category/reactivity: reactive TOCO: quiet SVE: deferred      Discharge Condition: Stable  Disposition:  Discharge disposition: 01-Home or Self Care        Allergies as of 12/31/2020   No Known Allergies     Medication List    TAKE these medications   famotidine 10 MG tablet Commonly known as: PEPCID Take 20 mg by mouth in the morning and at bedtime.   Prenate Pixie 10-0.6-0.4-200 MG Caps Take 1 capsule by mouth daily.        SignedHaroldine Laws, CNM 01/13/2021 4:40 PM

## 2020-12-31 NOTE — Progress Notes (Signed)
Pt Angelica Fisher 30 y.o. presents to L&D for scheduled NS. Pt is a G3P1011 at [redacted]w[redacted]d with cesarean section scheduled for Friday May 27. Pt denies signs and symptons consistent with rupture of membranes or active vaginal bleeding. Pt denies contractions and states positive fetal movement. External FM and TOCO applied to non-tender abdomen and assessing. Initial FHR 125 . Vital signs obtained and within normal limits.

## 2020-12-31 NOTE — Progress Notes (Signed)
NST reactive and appropriate for gestational age. Angelica Fisher, CNM reviewed strip and pt d/c home in stable condition. An After Visit Summary was printed and given to the patient. Discharge education completed with patient/family including follow up instructions. Pt has a follow-up appointment scheduled for tomorrow May 25th and scheduled cesarean section for Friday May 27th. Pt received labor and bleeding precautions. Patient able to verbalize understanding, all questions fully answered. Patient instructed to return to ED, call 911, or call MD for any changes in condition. Pt discharged home via personal vehicle with support person.

## 2021-01-01 DIAGNOSIS — O10913 Unspecified pre-existing hypertension complicating pregnancy, third trimester: Secondary | ICD-10-CM | POA: Diagnosis not present

## 2021-01-01 DIAGNOSIS — O0993 Supervision of high risk pregnancy, unspecified, third trimester: Secondary | ICD-10-CM | POA: Diagnosis not present

## 2021-01-01 DIAGNOSIS — O24414 Gestational diabetes mellitus in pregnancy, insulin controlled: Secondary | ICD-10-CM | POA: Diagnosis not present

## 2021-01-01 MED ORDER — CEFAZOLIN IN SODIUM CHLORIDE 3-0.9 GM/100ML-% IV SOLN
3.0000 g | INTRAVENOUS | Status: DC
  Filled 2021-01-01: qty 100

## 2021-01-01 MED ORDER — GABAPENTIN 300 MG PO CAPS
300.0000 mg | ORAL_CAPSULE | Freq: Once | ORAL | Status: AC
  Administered 2021-01-02: 300 mg via ORAL
  Filled 2021-01-01: qty 1

## 2021-01-01 MED ORDER — ORAL CARE MOUTH RINSE: 15.0000 mL | Freq: Once | OROMUCOSAL | Status: AC

## 2021-01-01 MED ORDER — LACTATED RINGERS IV SOLN: INTRAVENOUS | Status: DC

## 2021-01-01 MED ORDER — ACETAMINOPHEN 500 MG PO TABS
1000.0000 mg | ORAL_TABLET | Freq: Once | ORAL | Status: AC
  Administered 2021-01-02: 1000 mg via ORAL
  Filled 2021-01-01: qty 2

## 2021-01-01 MED ORDER — SOD CITRATE-CITRIC ACID 500-334 MG/5ML PO SOLN
30.0000 mL | ORAL | Status: AC
  Administered 2021-01-02: 30 mL via ORAL
  Filled 2021-01-01: qty 15

## 2021-01-01 MED ORDER — CHLORHEXIDINE GLUCONATE 0.12 % MT SOLN
15.0000 mL | Freq: Once | OROMUCOSAL | Status: AC
  Administered 2021-01-02: 15 mL via OROMUCOSAL
  Filled 2021-01-01: qty 15

## 2021-01-02 ENCOUNTER — Encounter
Admission: RE | Disposition: A | Discharge status: Home / Self Care | Payer: Self-pay | Source: Home / Self Care | Attending: Obstetrics and Gynecology

## 2021-01-02 ENCOUNTER — Inpatient Hospital Stay
Admission: RE | Admit: 2021-01-02 | Discharge: 2021-01-04 | DRG: 784 | Disposition: A | Discharge status: Home / Self Care | Payer: Medicaid Other | Attending: Obstetrics and Gynecology | Admitting: Obstetrics and Gynecology

## 2021-01-02 ENCOUNTER — Other Ambulatory Visit: Payer: Self-pay

## 2021-01-02 ENCOUNTER — Inpatient Hospital Stay: Payer: Medicaid Other | Admitting: Anesthesiology

## 2021-01-02 ENCOUNTER — Encounter: Payer: Self-pay | Admitting: Obstetrics and Gynecology

## 2021-01-02 DIAGNOSIS — O9081 Anemia of the puerperium: Secondary | ICD-10-CM | POA: Diagnosis not present

## 2021-01-02 DIAGNOSIS — Z3A00 Weeks of gestation of pregnancy not specified: Secondary | ICD-10-CM | POA: Diagnosis not present

## 2021-01-02 DIAGNOSIS — R21 Rash and other nonspecific skin eruption: Secondary | ICD-10-CM | POA: Diagnosis not present

## 2021-01-02 DIAGNOSIS — O24424 Gestational diabetes mellitus in childbirth, insulin controlled: Secondary | ICD-10-CM | POA: Diagnosis present

## 2021-01-02 DIAGNOSIS — O34211 Maternal care for low transverse scar from previous cesarean delivery: Principal | ICD-10-CM | POA: Diagnosis present

## 2021-01-02 DIAGNOSIS — D62 Acute posthemorrhagic anemia: Secondary | ICD-10-CM | POA: Diagnosis not present

## 2021-01-02 DIAGNOSIS — Z3A37 37 weeks gestation of pregnancy: Secondary | ICD-10-CM | POA: Diagnosis not present

## 2021-01-02 DIAGNOSIS — O1002 Pre-existing essential hypertension complicating childbirth: Secondary | ICD-10-CM | POA: Diagnosis present

## 2021-01-02 DIAGNOSIS — Z87891 Personal history of nicotine dependence: Secondary | ICD-10-CM

## 2021-01-02 DIAGNOSIS — O365930 Maternal care for other known or suspected poor fetal growth, third trimester, not applicable or unspecified: Secondary | ICD-10-CM | POA: Diagnosis present

## 2021-01-02 DIAGNOSIS — O1092 Unspecified pre-existing hypertension complicating childbirth: Secondary | ICD-10-CM | POA: Diagnosis not present

## 2021-01-02 DIAGNOSIS — Z9889 Other specified postprocedural states: Secondary | ICD-10-CM

## 2021-01-02 DIAGNOSIS — O24419 Gestational diabetes mellitus in pregnancy, unspecified control: Secondary | ICD-10-CM | POA: Diagnosis not present

## 2021-01-02 DIAGNOSIS — O34219 Maternal care for unspecified type scar from previous cesarean delivery: Secondary | ICD-10-CM | POA: Diagnosis present

## 2021-01-02 DIAGNOSIS — O99892 Other specified diseases and conditions complicating childbirth: Secondary | ICD-10-CM | POA: Diagnosis not present

## 2021-01-02 DIAGNOSIS — O24429 Gestational diabetes mellitus in childbirth, unspecified control: Secondary | ICD-10-CM | POA: Diagnosis not present

## 2021-01-02 DIAGNOSIS — Z302 Encounter for sterilization: Secondary | ICD-10-CM

## 2021-01-02 DIAGNOSIS — O169 Unspecified maternal hypertension, unspecified trimester: Secondary | ICD-10-CM | POA: Diagnosis not present

## 2021-01-02 LAB — URINE DRUG SCREEN, QUALITATIVE (ARMC ONLY)
Amphetamines, Ur Screen: NOT DETECTED
Barbiturates, Ur Screen: NOT DETECTED
Benzodiazepine, Ur Scrn: NOT DETECTED
Cannabinoid 50 Ng, Ur ~~LOC~~: POSITIVE — AB
Cocaine Metabolite,Ur ~~LOC~~: NOT DETECTED
MDMA (Ecstasy)Ur Screen: NOT DETECTED
Methadone Scn, Ur: NOT DETECTED
Opiate, Ur Screen: NOT DETECTED
Phencyclidine (PCP) Ur S: NOT DETECTED
Tricyclic, Ur Screen: NOT DETECTED

## 2021-01-02 LAB — ABO/RH: ABO/RH(D): O POS

## 2021-01-02 LAB — GLUCOSE, CAPILLARY: Glucose-Capillary: 76 mg/dL (ref 70–99)

## 2021-01-02 SURGERY — Surgical Case
Anesthesia: Spinal | Laterality: Bilateral

## 2021-01-02 MED ORDER — DIPHENHYDRAMINE HCL 50 MG/ML IJ SOLN
INTRAMUSCULAR | Status: AC
  Filled 2021-01-02: qty 1

## 2021-01-02 MED ORDER — FENTANYL CITRATE (PF) 100 MCG/2ML IJ SOLN
INTRAMUSCULAR | Status: AC
  Filled 2021-01-02: qty 2

## 2021-01-02 MED ORDER — BUPIVACAINE HCL (PF) 0.5 % IJ SOLN
INTRAMUSCULAR | Status: AC
  Filled 2021-01-02: qty 30

## 2021-01-02 MED ORDER — WITCH HAZEL-GLYCERIN EX PADS: 1.0000 "application " | MEDICATED_PAD | CUTANEOUS | Status: DC | PRN

## 2021-01-02 MED ORDER — SODIUM CHLORIDE 0.9 % IV SOLN
INTRAVENOUS | Status: DC | PRN
  Administered 2021-01-02: 50 ug/min via INTRAVENOUS

## 2021-01-02 MED ORDER — ENOXAPARIN SODIUM 40 MG/0.4ML IJ SOSY
40.0000 mg | PREFILLED_SYRINGE | INTRAMUSCULAR | Status: DC
  Administered 2021-01-03: 40 mg via SUBCUTANEOUS
  Filled 2021-01-02: qty 0.4

## 2021-01-02 MED ORDER — DEXTROSE 5 % IV SOLN
3.0000 g | Freq: Once | INTRAVENOUS | Status: DC
  Filled 2021-01-02: qty 3000

## 2021-01-02 MED ORDER — SIMETHICONE 80 MG PO CHEW
80.0000 mg | CHEWABLE_TABLET | ORAL | Status: DC | PRN
  Administered 2021-01-02 – 2021-01-03 (×2): 80 mg via ORAL
  Filled 2021-01-02 (×3): qty 1

## 2021-01-02 MED ORDER — TETANUS-DIPHTH-ACELL PERTUSSIS 5-2.5-18.5 LF-MCG/0.5 IM SUSY: 0.5000 mL | PREFILLED_SYRINGE | Freq: Once | INTRAMUSCULAR | Status: DC

## 2021-01-02 MED ORDER — OXYTOCIN-SODIUM CHLORIDE 30-0.9 UT/500ML-% IV SOLN
INTRAVENOUS | Status: AC
  Filled 2021-01-02: qty 1000

## 2021-01-02 MED ORDER — BUPIVACAINE LIPOSOME 1.3 % IJ SUSP
INTRAMUSCULAR | Status: AC
  Filled 2021-01-02: qty 20

## 2021-01-02 MED ORDER — TRANEXAMIC ACID-NACL 1000-0.7 MG/100ML-% IV SOLN
INTRAVENOUS | Status: AC
  Filled 2021-01-02: qty 100

## 2021-01-02 MED ORDER — KETOROLAC TROMETHAMINE 30 MG/ML IJ SOLN
30.0000 mg | Freq: Four times a day (QID) | INTRAMUSCULAR | Status: AC
  Administered 2021-01-02 – 2021-01-03 (×3): 30 mg via INTRAVENOUS
  Filled 2021-01-02 (×3): qty 1

## 2021-01-02 MED ORDER — LIDOCAINE HCL (PF) 2 % IJ SOLN
INTRAMUSCULAR | Status: AC
  Filled 2021-01-02: qty 20

## 2021-01-02 MED ORDER — IBUPROFEN 600 MG PO TABS
600.0000 mg | ORAL_TABLET | Freq: Four times a day (QID) | ORAL | Status: DC
  Administered 2021-01-03 – 2021-01-04 (×5): 600 mg via ORAL
  Filled 2021-01-02 (×5): qty 1

## 2021-01-02 MED ORDER — DEXTROSE 5 % IV SOLN
3.0000 g | INTRAVENOUS | Status: AC
  Administered 2021-01-02: 3 g via INTRAVENOUS
  Filled 2021-01-02: qty 3

## 2021-01-02 MED ORDER — OXYCODONE HCL 5 MG PO TABS
5.0000 mg | ORAL_TABLET | ORAL | Status: DC | PRN
  Administered 2021-01-03: 5 mg via ORAL
  Administered 2021-01-03 (×3): 10 mg via ORAL
  Administered 2021-01-03 – 2021-01-04 (×2): 5 mg via ORAL
  Administered 2021-01-04: 10 mg via ORAL
  Filled 2021-01-02: qty 2
  Filled 2021-01-02 (×3): qty 1
  Filled 2021-01-02 (×3): qty 2
  Filled 2021-01-02: qty 1

## 2021-01-02 MED ORDER — CARBOPROST TROMETHAMINE 250 MCG/ML IM SOLN
INTRAMUSCULAR | Status: AC
  Filled 2021-01-02: qty 1

## 2021-01-02 MED ORDER — DIBUCAINE (PERIANAL) 1 % EX OINT: 1.0000 "application " | TOPICAL_OINTMENT | CUTANEOUS | Status: DC | PRN

## 2021-01-02 MED ORDER — GABAPENTIN 300 MG PO CAPS
300.0000 mg | ORAL_CAPSULE | Freq: Every day | ORAL | Status: DC
  Administered 2021-01-02 – 2021-01-03 (×2): 300 mg via ORAL
  Filled 2021-01-02 (×2): qty 1

## 2021-01-02 MED ORDER — ZOLPIDEM TARTRATE 5 MG PO TABS: 5.0000 mg | ORAL_TABLET | Freq: Every evening | ORAL | Status: DC | PRN

## 2021-01-02 MED ORDER — BUPIVACAINE IN DEXTROSE 0.75-8.25 % IT SOLN
INTRATHECAL | Status: DC | PRN
  Administered 2021-01-02: 1.7 mL via INTRATHECAL

## 2021-01-02 MED ORDER — ACETAMINOPHEN 500 MG PO TABS
1000.0000 mg | ORAL_TABLET | Freq: Four times a day (QID) | ORAL | Status: DC
  Administered 2021-01-02 – 2021-01-04 (×7): 1000 mg via ORAL
  Filled 2021-01-02 (×8): qty 2

## 2021-01-02 MED ORDER — KETOROLAC TROMETHAMINE 30 MG/ML IJ SOLN
INTRAMUSCULAR | Status: DC | PRN
  Administered 2021-01-02: 30 mg via INTRAVENOUS

## 2021-01-02 MED ORDER — DIPHENHYDRAMINE HCL 50 MG/ML IJ SOLN
INTRAMUSCULAR | Status: DC | PRN
  Administered 2021-01-02: 25 mg via INTRAVENOUS

## 2021-01-02 MED ORDER — ONDANSETRON HCL 4 MG/2ML IJ SOLN
INTRAMUSCULAR | Status: DC | PRN
  Administered 2021-01-02: 4 mg via INTRAVENOUS

## 2021-01-02 MED ORDER — OXYTOCIN-SODIUM CHLORIDE 30-0.9 UT/500ML-% IV SOLN
INTRAVENOUS | Status: DC | PRN
  Administered 2021-01-02: 60 [IU] via INTRAVENOUS

## 2021-01-02 MED ORDER — SODIUM CHLORIDE FLUSH 0.9 % IV SOLN
INTRAVENOUS | Status: DC | PRN
  Administered 2021-01-02: 100 mL

## 2021-01-02 MED ORDER — MENTHOL 3 MG MT LOZG
1.0000 | LOZENGE | OROMUCOSAL | Status: DC | PRN
  Filled 2021-01-02: qty 9

## 2021-01-02 MED ORDER — DIPHENHYDRAMINE HCL 25 MG PO CAPS: 25.0000 mg | ORAL_CAPSULE | Freq: Four times a day (QID) | ORAL | Status: DC | PRN

## 2021-01-02 MED ORDER — COCONUT OIL OIL: 1.0000 "application " | TOPICAL_OIL | Status: DC | PRN

## 2021-01-02 MED ORDER — PRENATAL MULTIVITAMIN CH
1.0000 | ORAL_TABLET | Freq: Every day | ORAL | Status: DC
  Administered 2021-01-03 – 2021-01-04 (×2): 1 via ORAL
  Filled 2021-01-02 (×2): qty 1

## 2021-01-02 MED ORDER — MORPHINE SULFATE (PF) 2 MG/ML IV SOLN: 1.0000 mg | INTRAVENOUS | Status: DC | PRN

## 2021-01-02 MED ORDER — FENTANYL CITRATE (PF) 100 MCG/2ML IJ SOLN
INTRAMUSCULAR | Status: DC | PRN
  Administered 2021-01-02: 15 ug via INTRATHECAL

## 2021-01-02 MED ORDER — SODIUM CHLORIDE (PF) 0.9 % IJ SOLN
INTRAMUSCULAR | Status: AC
  Filled 2021-01-02: qty 50

## 2021-01-02 MED ORDER — SIMETHICONE 80 MG PO CHEW
80.0000 mg | CHEWABLE_TABLET | Freq: Three times a day (TID) | ORAL | Status: DC
  Administered 2021-01-02 – 2021-01-04 (×5): 80 mg via ORAL
  Filled 2021-01-02 (×4): qty 1

## 2021-01-02 MED ORDER — OXYTOCIN-SODIUM CHLORIDE 30-0.9 UT/500ML-% IV SOLN: 2.5000 [IU]/h | INTRAVENOUS | Status: AC

## 2021-01-02 MED ORDER — MORPHINE SULFATE (PF) 0.5 MG/ML IJ SOLN
INTRAMUSCULAR | Status: AC
  Filled 2021-01-02: qty 10

## 2021-01-02 MED ORDER — LABETALOL HCL 200 MG PO TABS
200.0000 mg | ORAL_TABLET | Freq: Two times a day (BID) | ORAL | Status: DC
  Administered 2021-01-02 – 2021-01-04 (×4): 200 mg via ORAL
  Filled 2021-01-02 (×4): qty 1

## 2021-01-02 MED ORDER — SENNOSIDES-DOCUSATE SODIUM 8.6-50 MG PO TABS
2.0000 | ORAL_TABLET | Freq: Every day | ORAL | Status: DC
  Administered 2021-01-03 – 2021-01-04 (×2): 2 via ORAL
  Filled 2021-01-02 (×2): qty 2

## 2021-01-02 MED ORDER — MORPHINE SULFATE (PF) 0.5 MG/ML IJ SOLN
INTRAMUSCULAR | Status: DC | PRN
  Administered 2021-01-02: .1 mg via INTRATHECAL

## 2021-01-02 MED ORDER — BUPIVACAINE HCL (PF) 0.5 % IJ SOLN
INTRAMUSCULAR | Status: AC
  Filled 2021-01-02: qty 10

## 2021-01-02 SURGICAL SUPPLY — 30 items
BARRIER ADHS 3X4 INTERCEED (GAUZE/BANDAGES/DRESSINGS) ×2 IMPLANT
CHLORAPREP W/TINT 26 (MISCELLANEOUS) ×2 IMPLANT
COVER WAND RF STERILE (DRAPES) ×2 IMPLANT
DRSG TELFA 3X8 NADH (GAUZE/BANDAGES/DRESSINGS) ×2 IMPLANT
ELECT CAUTERY BLADE 6.4 (BLADE) ×2 IMPLANT
ELECT REM PT RETURN 9FT ADLT (ELECTROSURGICAL) ×2
ELECTRODE REM PT RTRN 9FT ADLT (ELECTROSURGICAL) ×1 IMPLANT
GAUZE SPONGE 4X4 12PLY STRL (GAUZE/BANDAGES/DRESSINGS) ×2 IMPLANT
GLOVE SURG SYN 8.0 (GLOVE) ×2 IMPLANT
GOWN STRL REUS W/ TWL LRG LVL3 (GOWN DISPOSABLE) ×2 IMPLANT
GOWN STRL REUS W/ TWL XL LVL3 (GOWN DISPOSABLE) ×1 IMPLANT
GOWN STRL REUS W/TWL LRG LVL3 (GOWN DISPOSABLE) ×2
GOWN STRL REUS W/TWL XL LVL3 (GOWN DISPOSABLE) ×1
MANIFOLD NEPTUNE II (INSTRUMENTS) ×2 IMPLANT
MAT PREVALON FULL STRYKER (MISCELLANEOUS) ×2 IMPLANT
NEEDLE HYPO 22GX1.5 SAFETY (NEEDLE) ×2 IMPLANT
NS IRRIG 1000ML POUR BTL (IV SOLUTION) ×2 IMPLANT
PACK C SECTION AR (MISCELLANEOUS) ×2 IMPLANT
PAD OB MATERNITY 4.3X12.25 (PERSONAL CARE ITEMS) ×2 IMPLANT
PAD PREP 24X41 OB/GYN DISP (PERSONAL CARE ITEMS) ×2 IMPLANT
RETRACTOR TRAXI PANNICULUS (MISCELLANEOUS) ×1 IMPLANT
STRAP SAFETY 5IN WIDE (MISCELLANEOUS) ×2 IMPLANT
SUCT VACUUM KIWI BELL (SUCTIONS) ×2 IMPLANT
SUT CHROMIC 1 CTX 36 (SUTURE) ×6 IMPLANT
SUT CHROMIC 2 0 CT 1 (SUTURE) ×2 IMPLANT
SUT PLAIN GUT 0 (SUTURE) ×4 IMPLANT
SUT VIC AB 0 CT1 36 (SUTURE) ×4 IMPLANT
SYR 30ML LL (SYRINGE) ×4 IMPLANT
TAPE PAPER 2X10 WHT MICROPORE (GAUZE/BANDAGES/DRESSINGS) ×2 IMPLANT
TRAXI PANNICULUS RETRACTOR (MISCELLANEOUS) ×1

## 2021-01-02 NOTE — Anesthesia Procedure Notes (Signed)
Spinal  Patient location during procedure: OR Start time: 01/02/2021 11:00 AM End time: 01/02/2021 11:04 AM Reason for block: surgical anesthesia Staffing Performed: resident/CRNA  Anesthesiologist: Alver Fisher, MD Resident/CRNA: Karoline Caldwell, CRNA Preanesthetic Checklist Completed: patient identified, IV checked, site marked, risks and benefits discussed, surgical consent, monitors and equipment checked, pre-op evaluation and timeout performed Spinal Block Patient position: sitting Prep: DuraPrep Patient monitoring: heart rate, cardiac monitor, continuous pulse ox and blood pressure Approach: midline Location: L3-4 Injection technique: single-shot Needle Needle type: Sprotte  Needle gauge: 24 G Needle length: 9 cm Assessment Sensory level: T4 Events: CSF return

## 2021-01-02 NOTE — Brief Op Note (Signed)
01/02/2021  12:20 PM  PATIENT:  Angelica Fisher  30 y.o. female  PRE-OPERATIVE DIAGNOSIS:  repeat cesarean, CHTN, A2GDM,elective sterilization   POST-OPERATIVE DIAGNOSIS:  repeat cesarean, CHTN, A2GDM,, elective sterilization  PROCEDURE:  Procedure(s): REPEAT CESAREAN SECTION WITH BILATERAL TUBAL LIGATION (Bilateral)  SURGEON:  Surgeon(s) and Role:    * Jai Bear, Ihor Austin, MD - Primary  PHYSICIAN ASSISTANT: Haroldine Laws , CNM  ASSISTANTS: none   ANESTHESIA:   spinal  EBL:  385 mL   BLOOD ADMINISTERED:none  DRAINS: Urinary Catheter (Foley)   LOCAL MEDICATIONS USED:  MARCAINE    and BUPIVICAINE   SPECIMEN:  Source of Specimen:  portion right and left tubes  DISPOSITION OF SPECIMEN:  PATHOLOGY  COUNTS:  YES  TOURNIQUET:  * No tourniquets in log *  DICTATION: .Other Dictation: Dictation Number verbal  PLAN OF CARE: Admit to inpatient   PATIENT DISPOSITION:  PACU - hemodynamically stable.   Delay start of Pharmacological VTE agent (>24hrs) due to surgical blood loss or risk of bleeding: not applicable

## 2021-01-02 NOTE — Transfer of Care (Signed)
Immediate Anesthesia Transfer of Care Note  Patient: Angelica Fisher  Procedure(s) Performed: REPEAT CESAREAN SECTION WITH BILATERAL TUBAL LIGATION (Bilateral )  Patient Location: PACU  Anesthesia Type:spinal  Level of Consciousness: awake, alert  and oriented  Airway & Oxygen Therapy: Patient Spontanous Breathing  Post-op Assessment: Report given to RN and Post -op Vital signs reviewed and stable  Post vital signs: Reviewed and stable  Last Vitals:  Vitals Value Taken Time  BP 134/67 01/02/21 1235  Temp    Pulse 61 01/02/21 1235  Resp 20 01/02/21 1235  SpO2 96 % 01/02/21 1235    Last Pain:  Vitals:   01/02/21 1235  TempSrc:   PainSc: 0-No pain         Complications: No complications documented.

## 2021-01-02 NOTE — Op Note (Signed)
Angelica Fisher, Angelica Fisher MEDICAL RECORD NO: [id number] ACCOUNT NO: [id number] DATE OF BIRTH: 10/04/1990 FACILITY: [facility] LOCATION: [facility] PHYSICIAN: Suzy Bouchard, MD  Operative Report   PREOPERATIVE DIAGNOSES: 1.  37+4 weeks estimated gestational age. 2.  Chronic hypertension. 3.  A2 gestational diabetic. 4.  Elective repeat cesarean section. 5.  Elective permanent sterilization.  POSTOPERATIVE DIAGNOSES: 1.  37+4 weeks estimated gestational age. 2.  Chronic hypertension. 3.  A2 gestational diabetic. 4.  Elective repeat cesarean section. 5.  Elective permanent sterilization. 6.  Vigorous small SGA female delivered. 7.  True umbilical knot x 2.  PROCEDURE: 1.  Repeat low transverse cesarean section. 2.  Bilateral tubal ligation -- [place].  SURGEON:  Suzy Bouchard, MD  FIRST ASSISTANT:  Haroldine Laws, certified nurse midwife.  ANESTHESIA:  Spinal.  INDICATIONS:  This is a 30 year old gravida 3, para 1 patient with a prior cesarean section and has elected for repeat cesarean section and permanent sterilization.  The patient reconfirmed her desire for sterilization and is aware of the failure rate of  1 per 300 per year.  DESCRIPTION OF PROCEDURE:  After adequate spinal anesthesia, the patient was placed in dorsal supine position, hip roll on the right side.  The patient's abdomen was prepped and draped in normal sterile fashion.  The patient did receive 3 grams of IV  Ancef prior to commencement of the case for surgical prophylaxis.  The patient seemed to have a rash that developed shortly after administration and anesthesia administered Benadryl intravenous to decrease rash formation.  Timeout was performed.  Traxi  unit was used to elevate the pannus.  A Pfannenstiel incision was made two fingerbreadths above the symphysis pubis.  Sharp dissection was used to identify the fascia.  Fascia was opened in the midline and opened in a transverse fashion.  The superior   aspect of the fascia was grasped with Kocher clamps and recti muscles were dissected free.  The inferior aspect of the fascia was grasped with Kocher clamps and pyramidalis muscle was dissected free.  Entry into the peritoneal cavity was accomplished  sharply.  A direct low transverse uterine incision was made.  Upon entry into the endometrial cavity, clear fluid resulted.  The fetal head was elevated to the incision and a Kiwi vacuum was applied to the fetal occiput and aided in delivery of the head.   Vacuum was removed.  Shoulders and body were delivered without difficulty.  Infant was then dried on the mother's abdomen for delayed cord clamping for 60 seconds and the infant was then passed to nursery staff who assigned Apgar scores of 8 and 9, a  vigorous female seemingly small for gestational age, weighing 2630 grams.  The placenta was manually delivered.  Of note, there were two distinct cords wrapped amongst themselves in the umbilical cord.  The uterus was exteriorized and the endometrial  cavity was wiped clean with laparotomy tape.  Uterine incision was then closed with 1 chromic suture in a running locking fashion.  Two additional figure-of-eight sutures were required for hemostasis.  Attention was then directed to the patient's right  fallopian tube, which was grasped with a Babcock clamp.  Two separate 0 plain gut sutures were placed and a 1.5 cm portion of fallopian tube was removed.  Similar procedure was repeated on the patient's left fallopian tube.  Again, after placing 2  separate 0 plain gut sutures, a 1.5 cm portion of fallopian tube was removed.  Good hemostasis was noted.  The  patient's abdomen was irrigated and suctioned and the uterus was placed back into the abdominal cavity.  Pericolic gutters were wiped clean  with laparotomy tape and each tubal ligation site appeared hemostatic.  Uterine incision appeared hemostatic.  Interceed was placed over the uterine incision.  The fascia was  then closed with 0 Vicryl suture in a running nonlocking fashion.  Good  approximation of edges.  The fascial edges were then injected with a solution of 20 mL of 1.3% Exparel plus 30 mL 0.5% Marcaine plus 50 mL normal saline, approximately 50 mL solution was injected.  Subcutaneous tissues were irrigated and bovied and given  the depth of the subcutaneous tissues, a 4.5 cm dead space was closed with a 2-0 chromic suture and the skin was reapproximated with Insorb staples.  Good cosmetic effect noted.  Additional 40 mL of Exparel solution was injected beneath the skin.  There  were no complications.  INTRAOPERATIVE FLUIDS:  1000 mL.  QUANTITATIVE BLOOD LOSS:  385 mL.  URINE OUTPUT:  100 mL.  The patient was taken to recovery room in good condition.   NIK D: 01/02/2021 12:54:45 pm T: 01/02/2021 11:41:00 pm  JOB: [id number]/ [id number]

## 2021-01-02 NOTE — Lactation Note (Signed)
This note was copied from a baby's chart. Lactation Consultation Note  Patient Name: Angelica Fisher Date: 01/02/2021 Reason for consult: L&D Initial assessment;1st time breastfeeding;Early term 37-38.6wks Age:30 hours  Maternal Data Has patient been taught Hand Expression?: Yes Does the patient have breastfeeding experience prior to this delivery?: Yes How long did the patient breastfeed?: 5 days, mom states she was told her milk was "salty"  Feeding Mother's Current Feeding Choice: Breast Milk Assisted with latch on right breast in football hold after mom expressed drops of colostrum, tissue around areola firm but able get deep latch with sandwiching breast, breast support needed to help baby maintain latch, baby nursed 20 min before tiring and coming off breast, transferred to left breast in football hold and baby was able to deeply latch with shaping and supporting breast   LATCH Score Latch: Grasps breast easily, tongue down, lips flanged, rhythmical sucking.  Audible Swallowing: A few with stimulation  Type of Nipple: Everted at rest and after stimulation  Comfort (Breast/Nipple): Soft / non-tender  Hold (Positioning): Full assist, staff holds infant at breast  LATCH Score: 7   Lactation Tools Discussed/Used    Interventions Interventions: Breast feeding basics reviewed;Assisted with latch;Skin to skin;Hand express;Breast compression;Adjust position;Support pillows;Education  Discharge WIC Program: Yes  Consult Status Consult Status: Follow-up Date: 01/02/21 Follow-up type: In-patient    Angelica Fisher 01/02/2021, 1:51 PM

## 2021-01-02 NOTE — Anesthesia Preprocedure Evaluation (Signed)
Anesthesia Evaluation  Patient identified by MRN, date of birth, ID band Patient awake    Reviewed: Allergy & Precautions, NPO status , Patient's Chart, lab work & pertinent test results  History of Anesthesia Complications (+) PONV and history of anesthetic complications  Airway Mallampati: II  TM Distance: >3 FB Neck ROM: Full    Dental no notable dental hx.    Pulmonary neg sleep apnea, neg COPD, former smoker,    breath sounds clear to auscultation- rhonchi (-) wheezing      Cardiovascular hypertension, Pt. on medications (-) CAD, (-) Past MI, (-) Cardiac Stents and (-) CABG  Rhythm:Regular Rate:Normal - Systolic murmurs and - Diastolic murmurs    Neuro/Psych neg Seizures negative neurological ROS  negative psych ROS   GI/Hepatic Neg liver ROS, GERD  ,  Endo/Other  diabetes, Gestational  Renal/GU negative Renal ROS     Musculoskeletal negative musculoskeletal ROS (+)   Abdominal (+) + obese,   Peds  Hematology negative hematology ROS (+)   Anesthesia Other Findings Past Medical History: No date: GERD (gastroesophageal reflux disease) No date: Gestational diabetes No date: History of kidney stones No date: Hypertension No date: PONV (postoperative nausea and vomiting)   Reproductive/Obstetrics (+) Pregnancy                             Lab Results  Component Value Date   WBC 11.7 (H) 12/31/2020   HGB 12.8 12/31/2020   HCT 38.4 12/31/2020   MCV 86.9 12/31/2020   PLT 242 12/31/2020    Anesthesia Physical Anesthesia Plan  ASA: III  Anesthesia Plan: Spinal   Post-op Pain Management:    Induction:   PONV Risk Score and Plan: 3 and Ondansetron  Airway Management Planned: Natural Airway  Additional Equipment:   Intra-op Plan:   Post-operative Plan:   Informed Consent: I have reviewed the patients History and Physical, chart, labs and discussed the procedure  including the risks, benefits and alternatives for the proposed anesthesia with the patient or authorized representative who has indicated his/her understanding and acceptance.     Dental advisory given  Plan Discussed with: CRNA and Anesthesiologist  Anesthesia Plan Comments:         Anesthesia Quick Evaluation

## 2021-01-02 NOTE — Discharge Summary (Signed)
Obstetrical Discharge Summary  Patient Name: Angelica Fisher DOB: [birth year] MRN: [id number]  Date of Admission: 01/02/2021 Date of Delivery:01/02/2021 Delivered by: Beverly Gust MD Date of Discharge: 01/04/21 Primary OB: [facility]  QVZ:DGLOVFI'E last menstrual period was 04/14/2020 (exact date). EDC Estimated Date of Delivery: 01/19/21 Gestational Age at Delivery: [redacted]w[redacted]d   Antepartum complications: as below Admitting Diagnosis:  Secondary Diagnosis: Patient Active Problem List   Diagnosis Date Noted  . Previous cesarean delivery affecting pregnancy 01/02/2021  . Post-operative state 01/02/2021  . Indication for care in labor and delivery, antepartum 12/29/2020  . Chronic hypertension affecting pregnancy 12/29/2020  . Chronic hypertension during pregnancy, antepartum 12/24/2020  . Non-reactive NST (non-stress test) 12/04/2020  . Encounter for routine fetal ultrasound 09/30/2020  . Pregnancy with uncertain fetal viability 09/30/2020  . Essential hypertension 08/21/2020  . Pedal edema 08/21/2020  . SOB (shortness of breath) on exertion 08/21/2020  . Supervision of high risk pregnancy in second trimester 07/07/2020    Augmentation: N/A Complications: None Intrapartum complications/course: TRUE KNOT in umbilical cord x2  Date of Delivery: 01/04/2021  Delivered By: Beverly Gust MD Delivery Type: repeat cesarean section, low transverse incision Anesthesia: spinal Placenta: spontaneous Laceration: none Episiotomy: none Newborn Data: Live born female  Birth Weight: 5 lb 12.8 oz (2630 g) APGAR: 8, 9  Newborn Delivery   Birth date/time: 01/02/2021 11:28:00 Delivery type: C-Section, Vacuum Assisted Trial of labor: No C-section categorization: Repeat        Postpartum Procedures:   Edinburgh:  Edinburgh Postnatal Depression Scale Screening Tool 01/03/2021 01/03/2021 01/02/2021  I have been able to laugh and see the funny side of things. 0 (No Data) (No Data)  I  have looked forward with enjoyment to things. 0 - -  I have blamed myself unnecessarily when things went wrong. 1 - -  I have been anxious or worried for no good reason. 2 - -  I have felt scared or panicky for no good reason. 1 - -  Things have been getting on top of me. 1 - -  I have been so unhappy that I have had difficulty sleeping. 1 - -  I have felt sad or miserable. 1 - -  I have been so unhappy that I have been crying. 0 - -  The thought of harming myself has occurred to me. 0 - -  Edinburgh Postnatal Depression Scale Total 7 - -    Post partum course: Cesarean Section:  Patient had an uncomplicated postpartum course.  By time of discharge on POD#2, her pain was controlled on oral pain medications; she had appropriate lochia and was ambulating, voiding without difficulty, tolerating regular diet and passing flatus.   She was deemed stable for discharge to home.    Discharge Physical Exam:  BP 126/63 (BP Location: Right Arm)   Pulse 72   Temp 97.6 F (36.4 C) (Oral)   Resp 20   LMP 04/14/2020 (Exact Date) Comment: normal period  SpO2 100%   Breastfeeding Unknown   General: NAD CV: RRR Pulm: CTABL, nl effort ABD: s/nd/nt, fundus firm and below the umbilicus Lochia: moderate Incision: c/d/i DVT Evaluation: LE non-ttp, no evidence of DVT on exam.  Hemoglobin  Date Value Ref Range Status  01/03/2021 10.8 (L) 12.0 - 15.0 g/dL Final   HCT  Date Value Ref Range Status  01/03/2021 31.6 (L) 36.0 - 46.0 % Final     Disposition: stable, discharge to home. Baby Feeding: breastmilk and formula Baby Disposition: home  with mom  Rh Immune globulin given: n/a Rubella vaccine given: immune Tdap vaccine given in AP or PP setting: 10/24/20 Flu vaccine given in AP or PP setting: 09/03/20  Contraception: BTL completed Prenatal Labs:   ABO, Rh:  O+ Antibody:  neg Rubella:  Imm / Varicella : Imm RPR:   nr HBsAg:   neg HIV:   Neg  GBS:   pending   Plan:  Angelica Fisher was  discharged to home in good condition. Follow-up appointment with delivering provider in 2 weeks.  Discharge Medications: Allergies as of 01/04/2021   No Known Allergies     Medication List    STOP taking these medications   Aspirin Low Dose 81 MG EC tablet Generic drug: aspirin   insulin NPH Human 100 UNIT/ML injection Commonly known as: NOVOLIN N   insulin regular 100 units/mL injection Commonly known as: NOVOLIN R   INSULIN SYRINGE 1CC/31GX5/16" 31G X 5/16" 1 ML Misc     TAKE these medications   acetaminophen 500 MG tablet Commonly known as: TYLENOL Take 2 tablets (1,000 mg total) by mouth every 6 (six) hours.   calcium carbonate 500 MG chewable tablet Commonly known as: TUMS - dosed in mg elemental calcium Chew 2 tablets (400 mg of elemental calcium total) by mouth every 8 (eight) hours as needed for heartburn or indigestion.   coconut oil Oil Apply 1 application topically as needed.   famotidine 10 MG tablet Commonly known as: PEPCID Take 20 mg by mouth in the morning and at bedtime.   ibuprofen 600 MG tablet Commonly known as: ADVIL Take 1 tablet (600 mg total) by mouth every 6 (six) hours.   labetalol 200 MG tablet Commonly known as: NORMODYNE Take 1 tablet (200 mg total) by mouth 2 (two) times daily. What changed:   medication strength  how much to take  when to take this  additional instructions   oxyCODONE 5 MG immediate release tablet Commonly known as: Oxy IR/ROXICODONE Take 1 tablet (5 mg total) by mouth every 6 (six) hours as needed for up to 7 days for moderate pain.   Prenate Pixie 10-0.6-0.4-200 MG Caps Take 1 capsule by mouth daily.   senna-docusate 8.6-50 MG tablet Commonly known as: Senokot-S Take 2 tablets by mouth daily.   simethicone 80 MG chewable tablet Commonly known as: MYLICON Chew 1 tablet (80 mg total) by mouth 3 (three) times daily after meals.        Follow-up Information    Schermerhorn, Ihor Austin, MD. Schedule  an appointment as soon as possible for a visit in 2 week(s).   Specialty: Obstetrics and Gynecology Why: post-op followup Contact information: [address] [place] [address] [phone]               Signed: Randa Ngo, CNM 01/04/2021 9:30 AM

## 2021-01-02 NOTE — Progress Notes (Signed)
Pt is ready for her repeat LTCS and BTl . All questions answered. Labs reviewed . NPO . proceed .

## 2021-01-03 LAB — CBC
HCT: 31.6 % — ABNORMAL LOW (ref 36.0–46.0)
Hemoglobin: 10.8 g/dL — ABNORMAL LOW (ref 12.0–15.0)
MCH: 29.5 pg (ref 26.0–34.0)
MCHC: 34.2 g/dL (ref 30.0–36.0)
MCV: 86.3 fL (ref 80.0–100.0)
Platelets: 180 10*3/uL (ref 150–400)
RBC: 3.66 MIL/uL — ABNORMAL LOW (ref 3.87–5.11)
RDW: 14.4 % (ref 11.5–15.5)
WBC: 10.3 10*3/uL (ref 4.0–10.5)
nRBC: 0 % (ref 0.0–0.2)

## 2021-01-03 MED ORDER — CALCIUM CARBONATE ANTACID 500 MG PO CHEW
400.0000 mg | CHEWABLE_TABLET | Freq: Three times a day (TID) | ORAL | Status: DC | PRN
  Administered 2021-01-03: 400 mg via ORAL
  Filled 2021-01-03: qty 2

## 2021-01-03 NOTE — Clinical Social Work Maternal (Addendum)
CLINICAL SOCIAL WORK MATERNAL/CHILD NOTE  Patient Details  Name: Angelica Fisher MRN: [id number] Date of Birth: 1991/06/11  Date:  01/03/2021  Clinical Social Worker Initiating Note:  Oretha Ellis, LCSW Date/Time: Initiated:  01/03/21/1430     Child's Name:  Donnetta Hutching   Biological Parents:  Mother,Father (Father-[employer] 30 y/o)   Need for Interpreter:  None   Reason for Referral:  Current Substance Use/Substance Use During Pregnancy  (Marijuana)   Address:  [place] [address]    Phone number:  [phone] (home)      Additional phone number: None  Household Members/Support Persons (HM/SP):   Household Member/Support Person 1   HM/SP Name Relationship DOB or Age  HM/SP -1 Talon Pollyann Savoy 30 years old  HM/SP -2        HM/SP -3        HM/SP -4        HM/SP -5        HM/SP -6        HM/SP -7        HM/SP -8          Natural Supports (not living in the home):  Spouse/significant other,Immediate Family,Extended Family   Professional Supports: None   Employment: Animator   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, 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[employer])   Education:  [employer] school graduate   Homebound arranged:    Museum/gallery curator Resources:  Medicaid   Other Resources:  Physicist, medical ,[place] Considerations Which May Impact Care:  None provided  Strengths:  Ability to meet basic needs ,Home prepared for child ,Pediatrician chosen   Psychotropic Medications:         Pediatrician:    [employer]  Pediatrician List:   [facility]      Pediatrician Fax Number: [phone]  Risk Factors/Current Problems:  Substance Use    Cognitive State:  Alert ,Able to Concentrate    Mood/Affect:  Calm    CSW Assessment: CSW received a consult for drug exposed newborn. Baby's drug screen positive for Marijuana. Cord screen pending.  CSW spoke with RN Misty prior to meeting with MOB. Per RN, MOB is appropriate. CSW met with MOB and Normajean Baxter (75 y/o) at bedside. Explained CSW's role and reason for referral. MOB consented for FOB to stay in room for assessment.    MOB reported she is feeling good post delivery. MOB was alert, appropriate, and attentive to Baby during assessment. Confirmed contact information for MOB. MOB and Baby will be living with and son Ernestine Conrad, age 18) at discharge.    [facility] Drug Screen/CPS Report Policy. MOB reported she used Marijuana "a couple of times to reduce stress about C-section" during her pregnancy and her last use was "about a week ago." She denied other substance use. CPS Report made to [place] Worker [facility] following assessment with MOB, as required by policy. Informed Lonna Cobb of plan for MOB and Baby to discharge tomorrow (Sunday) and asked to be informed of any barriers to discharge prior to then. Lonna Cobb stated she would staff with her superviosr and provide update. CSW received a call after making report from [place] stating MOB and baby can discharge home and CPS worker will follow up with MOB/baby at home.     MOB reported she receives [facility]  and already informed her [employer] birth. MOB plans to use [facility] in [place] for [facility]. MOB reported she has a crib, bassinet, car seat (new), clothing, diapers, and all other items needed for Baby. MOB reported she has reliable transportation for herself and Baby. MOB denied resource needs at this time.    MOB reported she has no history of mental health. MOB reported overall she has a good support system and is coping well emotionally at this time. MOB denied SI, HI, or DV.   CSW provided education and information sheets on PPD and SIDS. MOB verbalized understanding. CSW encouraged MOB to reach out to her Provider with any questions or needs for support or resources.  MOB denied any needs or questions at this time. CSW encouraged MOB to reach out if any arise prior to discharge. Please re consult CSW if any additional needs or concerns arise.  CSW Plan/Description:  Hospital Drug Screen Policy Information,Perinatal Mood and Anxiety Disorder (PMADs) Education,Child Protective Service Report ,Sudden Infant Death Syndrome (SIDS) Education    Truitt Merle, LCSW 01/03/2021, 4:54 PM

## 2021-01-03 NOTE — Progress Notes (Signed)
Post Partum Day 1  Subjective: Doing well, no complaints.  Tolerating regular diet, pain with PO meds, voiding and ambulating without difficulty.  No CP SOB Fever,Chills, N/V or leg pain; denies nipple or breast pain, no HA change of vision, RUQ/epigastric pain  Objective: BP 126/68 (BP Location: Right Arm)   Pulse 76   Temp 98.4 F (36.9 C) (Oral)   Resp 18   LMP 04/14/2020 (Exact Date) Comment: normal period  SpO2 100%   Breastfeeding Unknown    Physical Exam:  General: NAD Breasts: soft/nontender CV: RRR Pulm: nl effort, CTABL Abdomen: soft, NT, BS x 4 Incision: Honeycomb Dsg no erythema or drainage Lochia: small Uterine Fundus: fundus firm and 2 fb below umbilicus DVT Evaluation: no cords, ttp LEs   Recent Labs    01/03/21 0628  HGB 10.8*  HCT 31.6*  WBC 10.3  PLT 180    Assessment/Plan: 30 y.o. [id number] postpartum day # 1  - Continue routine PP care - Lactation consult.  - BTL performed - Acute blood loss anemia - hemodynamically stable and asymptomatic; start po ferrous sulfate BID with stool softeners  - Immunization status: all Imms up to date    Disposition: Does not desire Dc home today.     Randa Ngo, CNM 01/03/2021  9:49 PM

## 2021-01-03 NOTE — Lactation Note (Signed)
This note was copied from a baby's chart. Lactation Consultation Note  Patient Name: Angelica Fisher [id number]'V Date: 01/03/2021 Reason for consult: Follow-up assessment;Mother's request;Difficult latch;Early term 37-38.6wks;Infant < 6lbs;Other (Comment) (Unable to get Angelica Fisher to take left breast) Age:30 hours  Maternal Data Has patient been taught Hand Expression?: Yes Does the patient have breastfeeding experience prior to this delivery?: Yes How long did the patient breastfeed?: 5 days  Feeding Mother's Current Feeding Choice: Breast Milk  Mom has been latching Angelica Fisher to the right breast independently, but is struggling to get her to latch to the left breast.  Assisted mom in comfortable position with pillow support with Angelica Fisher in football hold on the left breast skin to skin.  Demonstrated how to easily hand express colostrum to entice him to latch and sandwich breast for a deeper latch.  He latched and began strong rhythmic sucking with audible swallows heard.  Demonstrated how to massage breast and gently stimulate Angelica Fisher to him actively sucking at the breast.  He nursed for 11 minutes before falling asleep and coming off the left breast on his own refusing to re latch.  He spit a small amount of mucousy colostrum and mom wanted him back in the crib so she could eat her breakfast.  30 minutes later he was demonstrating feeding cues.  Mom put him on the right breast for 10 to 15 minutes.  Mom only breast fed her first baby who is now 29 years old for 5 days.  Mom reports he never really latched well and was told her milk had a salty taste which could be why he did not want to nurse.  Hand out given on what to expect with breast feeding the first 4 days of the baby's life and reviewed normal newborn stomach size, feeding cues, adequate intake and out put, supply and demand, normal course of lactation and routine newborn feeding patterns.  The plan is to circumcise Angelica Fisher this am.  Explained that he  may be a little sleepy after a circumcision.  Praised mom for her commitment to breast feed.  Lactation Resource sheet given and reviewed virtual support groups available, informational web sites and contact numbers.  Lactation name and number written on white board and encouraged to call with any questions, concerns or assistance. LATCH Score Latch: Repeated attempts needed to sustain latch, nipple held in mouth throughout feeding, stimulation needed to elicit sucking reflex.  Audible Swallowing: A few with stimulation  Type of Nipple: Everted at rest and after stimulation  Comfort (Breast/Nipple): Soft / non-tender  Hold (Positioning): Assistance needed to correctly position infant at breast and maintain latch.  LATCH Score: 7   Lactation Tools Discussed/Used    Interventions Interventions: Breast feeding basics reviewed;Assisted with latch;Skin to skin;Breast massage;Hand express;Reverse pressure;Breast compression;Adjust position;Support pillows;Position options;Education  Discharge Discharge Education: Engorgement and breast care;Warning signs for feeding baby;Other (comment) WIC Program: Yes  Consult Status Consult Status: Follow-up Follow-up type: Call as needed    Angelica Fisher 01/03/2021, 12:29 PM

## 2021-01-03 NOTE — Anesthesia Postprocedure Evaluation (Signed)
Anesthesia Post Note  Patient: JAQUASIA DOSCHER  Procedure(s) Performed: REPEAT CESAREAN SECTION WITH BILATERAL TUBAL LIGATION (Bilateral )  Patient location during evaluation: Mother Baby Anesthesia Type: Spinal Level of consciousness: oriented and awake and alert Pain management: pain level controlled Vital Signs Assessment: post-procedure vital signs reviewed and stable Respiratory status: spontaneous breathing and respiratory function stable Cardiovascular status: blood pressure returned to baseline and stable Postop Assessment: no headache, no backache, no apparent nausea or vomiting and able to ambulate Anesthetic complications: no   No complications documented.   Last Vitals:  Vitals:   01/03/21 0819 01/03/21 1210  BP: 122/62 125/68  Pulse: 69 77  Resp: 20 18  Temp: 36.9 C 36.7 C  SpO2: 98%     Last Pain:  Vitals:   01/03/21 1235  TempSrc:   PainSc: 6                  Donato Schultz

## 2021-01-03 NOTE — Lactation Note (Addendum)
This note was copied from a baby's chart. Lactation Consultation Note  Patient Name: Angelica Fisher OTRRN'H Date: 01/03/2021 Reason for consult: Follow-up assessment;Mother's request;Early term 37-38.6wks;Infant < 6lbs;Other (Comment) ([name] is still sleepy after circumcision) Age:30 hours  Maternal Data Has patient been taught Hand Expression?: Yes Does the patient have breastfeeding experience prior to this delivery?: Yes How long did the patient breastfeed?: 5 days  Feeding Mother's Current Feeding Choice: Breast Milk  [name] has been sleepy since the circumcision.  Assisted mom with breast feeding on left breast which is not the breast [name] prefers for 5 minutes of good rhythmic sucking with audible swallows.  Can easily hand express colostrum.  Mom requested to pump.  Symphony pump set up with instructions in breast massage, hand expression, when and how to pump, collection, storage, cleaning, labeling and handling of expressed milk.  Mom expressed 2 ml which was given by finger feeding using TB syringe.  After the TB syringe feeding, [name] was giving feeding cues.  Mom put him to the right breast where he fed vigorously for another 5 minutes before falling asleep again.   LATCH Score Latch: Grasps breast easily, tongue down, lips flanged, rhythmical sucking.  Audible Swallowing: Spontaneous and intermittent  Type of Nipple: Everted at rest and after stimulation  Comfort (Breast/Nipple): Soft / non-tender  Hold (Positioning): Assistance needed to correctly position infant at breast and maintain latch.  LATCH Score: 9   Lactation Tools Discussed/Used Tools: Pump;Other (comment) (Tender care lanolin) Breast pump type: Double-Electric Breast Pump;Manual Pump Education: Setup, frequency, and cleaning;Milk Storage;Other (comment) Reason for Pumping: Mom concerned that [name] is not getting enough b/c he is only feeding for about 5 minute intervals Pumping frequency:  Encouraged to mainly just keep putting [name] to the breast Pumped volume: 2 mL  Interventions Interventions: Assisted with latch;Breast feeding basics reviewed;Hand express;Breast massage;Support pillows;Position options  Discharge Discharge Education: Engorgement and breast care;Warning signs for feeding baby;Other (comment) Pump: DEBP (Symphony set up in room per mom's request for extra stimulation to bring milk in quicker) WIC Program: Yes  Consult Status Consult Status: Follow-up Follow-up type: Call as needed    Louis Meckel 01/03/2021, 9:09 PM

## 2021-01-04 ENCOUNTER — Ambulatory Visit: Payer: Self-pay

## 2021-01-04 ENCOUNTER — Encounter: Payer: Self-pay | Admitting: Obstetrics and Gynecology

## 2021-01-04 MED ORDER — IBUPROFEN 600 MG PO TABS: 600.0000 mg | ORAL_TABLET | Freq: Four times a day (QID) | ORAL | 0 refills | Status: AC

## 2021-01-04 MED ORDER — OXYCODONE HCL 5 MG PO TABS: 5.0000 mg | ORAL_TABLET | Freq: Four times a day (QID) | ORAL | 0 refills | Status: AC | PRN

## 2021-01-04 MED ORDER — CALCIUM CARBONATE ANTACID 500 MG PO CHEW: 400.0000 mg | CHEWABLE_TABLET | Freq: Three times a day (TID) | ORAL | Status: AC | PRN

## 2021-01-04 MED ORDER — LABETALOL HCL 200 MG PO TABS: 200.0000 mg | ORAL_TABLET | Freq: Two times a day (BID) | ORAL | 0 refills | Status: AC

## 2021-01-04 MED ORDER — SENNOSIDES-DOCUSATE SODIUM 8.6-50 MG PO TABS: 2.0000 | ORAL_TABLET | Freq: Every day | ORAL | 0 refills | Status: AC

## 2021-01-04 MED ORDER — ACETAMINOPHEN 500 MG PO TABS: 1000.0000 mg | ORAL_TABLET | Freq: Four times a day (QID) | ORAL | 0 refills | Status: AC

## 2021-01-04 MED ORDER — SIMETHICONE 80 MG PO CHEW: 80.0000 mg | CHEWABLE_TABLET | Freq: Three times a day (TID) | ORAL | 0 refills | Status: AC

## 2021-01-04 MED ORDER — COCONUT OIL OIL: 1.0000 "application " | TOPICAL_OIL | 0 refills | Status: AC | PRN

## 2021-01-04 NOTE — Lactation Note (Incomplete)
This note was copied from a baby's chart. Lactation Consultation Note  Patient Name: Angelica Fisher [id number] Date: 01/04/2021   Age:30 hours  Maternal Data    Feeding    LATCH Score                    Lactation Tools Discussed/Used    Interventions    Discharge    Consult Status      Louis Meckel 01/04/2021, 8:02 PM

## 2021-01-04 NOTE — Progress Notes (Signed)
Patient discharged home with infant. FOB present at discharge. Discharge instructions and prescriptions given and reviewed with patient. Patient verbalized understanding.   Explained importance of calling and scheduling 2-week follow-up appointment with Dr. Feliberto Gottron. Pt stated she would call Tuesday and schedule.   Provided pt with extra honeycomb dressing and went over incision care.   Escorted out by volunteers.

## 2021-01-04 NOTE — Discharge Instructions (Signed)
Discharge Instructions:   Follow-up Appointment: Call on Tuesday (5/31) and schedule your follow-up appointment for a visit in 2-weeks with Dr. Feliberto Gottron at [facility]!   If there are any new medications, they have been ordered and will be available for pickup at the listed pharmacy on your way home from the hospital.   Call office if you have any of the following: headache, visual changes, fever >101.0 F, chills, shortness of breath, breast concerns, excessive vaginal bleeding, incision drainage or problems, leg pain or redness, depression or any other concerns. If you have vaginal discharge with an odor, let your doctor know.   It is normal to bleed for up to 6 weeks. You should not soak through more than 1 pad in 1 hour. If you have a blood clot larger than your fist with continued bleeding, call your doctor.   After a c-section, you should expect a small amount of blood or clear fluid coming from the incision and abdominal cramping/soreness. Inspect your incision site daily. Stand in front of a mirror to look for any redness, incision opening, or discolored/odorness drainage. Take a shower daily and continue good hygiene. Use own towel and washcloth (do not share). Make sure your sheets on your bed are clean. No pets sleeping around your incision site. Dressing will be removed at your postpartum visit. If the dressing does become wet or soiled underneath, it is okay to remove it.   Activity: Do not lift > 10 lbs for 6 weeks (do not lift anything heavier than your baby). No intercourse, tampons, swimming pools, hot tubs, baths (only showers) for 6 weeks.  No driving for 1-2 weeks. Continue prenatal vitamin, especially if breastfeeding. Increase calories and fluids (water) while breastfeeding.   Your milk will come in, in the next couple of days (right now it is colostrum). You may have a slight fever when your milk comes in, but it should go away on its own.  If it does not, and rises  above 101 F please call the doctor. You will also feel achy and your breasts will be firm. They will also start to leak. If you are breastfeeding, continue as you have been and you can pump/express milk for comfort.   If you have too much milk, your breasts can become engorged, which could lead to mastitis. This is an infection of the milk ducts. It can be very painful and you will need to notify your doctor to obtain a prescription for antibiotics. You can also treat it with a shower or hot/cold compress.   For concerns about your baby, please call your pediatrician.  For breastfeeding concerns, the lactation consultant can be reached at [phone].   Postpartum blues (feelings of happy one minute and sad another minute) are normal for the first few weeks but if it gets worse let your doctor know.   Congratulations! We enjoyed caring for you and your new bundle of joy!

## 2021-01-05 ENCOUNTER — Telehealth: Payer: Self-pay

## 2021-01-05 NOTE — Telephone Encounter (Signed)
Transition Care Management Follow-up Telephone Call  Date of discharge and from where: 01/04/2021 from [facility]  How have you been since you were released from the hospital? Pt stated she doing well and pain is controlled.   Any questions or concerns? No  Items Reviewed:  Did the pt receive and understand the discharge instructions provided? Yes   Medications obtained and verified? No   Other? No   Any new allergies since your discharge? No   Dietary orders reviewed? n/a  Do you have support at home? Yes   Functional Questionnaire: (I = Independent and D = Dependent) ADLs: I  Bathing/Dressing- I  Meal Prep- I  Eating- I  Maintaining continence- I  Transferring/Ambulation- I  Managing Meds- I   Follow up appointments reviewed:   PCP Hospital f/u appt confirmed? No    Specialist Hospital f/u appt confirmed? No  Calling tomorrow for a follow up appt.   Are transportation arrangements needed? No   If their condition worsens, is the pt aware to call PCP or go to the Emergency Dept.? Yes  Was the patient provided with contact information for the PCP's office or ED? Yes  Was to pt encouraged to call back with questions or concerns? Yes

## 2021-01-07 LAB — SURGICAL PATHOLOGY

## 2021-01-14 DIAGNOSIS — O900 Disruption of cesarean delivery wound: Secondary | ICD-10-CM | POA: Diagnosis not present

## 2021-02-16 DIAGNOSIS — M791 Myalgia, unspecified site: Secondary | ICD-10-CM | POA: Diagnosis not present

## 2021-02-16 DIAGNOSIS — R5383 Other fatigue: Secondary | ICD-10-CM | POA: Diagnosis not present

## 2021-02-16 DIAGNOSIS — R197 Diarrhea, unspecified: Secondary | ICD-10-CM | POA: Diagnosis not present

## 2021-02-18 DIAGNOSIS — R5383 Other fatigue: Secondary | ICD-10-CM | POA: Diagnosis not present

## 2021-02-18 DIAGNOSIS — M791 Myalgia, unspecified site: Secondary | ICD-10-CM | POA: Diagnosis not present

## 2021-06-08 ENCOUNTER — Ambulatory Visit: Payer: Self-pay

## 2021-06-08 ENCOUNTER — Telehealth: Payer: Self-pay | Admitting: *Deleted

## 2021-06-08 NOTE — Patient Outreach (Signed)
Care Coordination  06/08/2021  Angelica Fisher 1990/09/02 [id number]  Reached patient via contact number and explained reason for call. Patient states she did not remember she had a telephone appointment today at 1:30 pm with this RNCM and requests return call after 4:00 pm today.  Plan:  Will call patient after 4 pm today per her request to complete the initial intake assessment via telephone.   Cranford Mon RN, CCM, CDCES [place]  [facility] Care Management Coordinator - Managed [place] High Risk [phone]

## 2021-06-08 NOTE — Patient Outreach (Signed)
Care Coordination  06/08/2021  Angelica Fisher [birth year] [id number]  06/08/2021 Name: Angelica Fisher MRN: [id number] DOB: 12/21/1990  Referred by: [facility] Reason for referral : High Risk Managed Medicaid (2nd Unsuccessful RNCM initial telephone outreach)   A second unsuccessful telephone outreach was attempted today. The patient was referred to the case management team for assistance with care management and care coordination.    Follow Up Plan: The Managed Medicaid care management team will reach out to the patient again over the next 7 days.   Cranford Mon RN, CCM, CDCES [place]  [facility] Care Management Coordinator - Managed [place] High Risk [phone]

## 2021-06-08 NOTE — Patient Instructions (Signed)
Gurney Maxin ,   The [facility] Managed Care Team is available to provide assistance to you with your healthcare needs at no cost and as a benefit of your [facility] Health plan. I'm sorry I was unable to reach you today for our scheduled appointment. Our care guide will call you to reschedule our telephone appointment. Please call me at the number below. I am available to be of assistance to you regarding your healthcare needs. .   Thank you,    Cranford Mon RN, CCM, CDCES [place]  [facility] Care Management Coordinator - Managed [place] High Risk [phone]

## 2021-06-23 ENCOUNTER — Other Ambulatory Visit: Payer: Self-pay | Admitting: *Deleted

## 2021-06-23 NOTE — Patient Outreach (Signed)
Care Coordination  06/23/2021  Angelica Fisher June 19, 1991 [id number]  Successful outreach to patient. After explaining the Medicaid Managed - High Risk Care Management /Care Coordination program and the role of the RNCM, patient declined services stating she is a single mother who has just secured a full time job and is not interested in participating in the program at this time.   Plan: Since patient has a diagnosis of HTN and is taking medication to treat the condition advised her of [place] Managed Medicaid health plan benefit that covers a home BP monitor.   Cranford Mon RN, CCM, CDCES [place]  [facility] Care Management Coordinator - Managed [place] High Risk [phone]

## 2021-08-16 DIAGNOSIS — H109 Unspecified conjunctivitis: Secondary | ICD-10-CM | POA: Diagnosis not present

## 2021-08-16 DIAGNOSIS — Z8679 Personal history of other diseases of the circulatory system: Secondary | ICD-10-CM | POA: Diagnosis not present

## 2021-08-16 DIAGNOSIS — J029 Acute pharyngitis, unspecified: Secondary | ICD-10-CM | POA: Diagnosis not present

## 2021-08-16 DIAGNOSIS — J069 Acute upper respiratory infection, unspecified: Secondary | ICD-10-CM | POA: Diagnosis not present

## 2021-08-16 DIAGNOSIS — Z760 Encounter for issue of repeat prescription: Secondary | ICD-10-CM | POA: Diagnosis not present

## 2021-09-23 IMAGING — US US MFM OB FOLLOW-UP
1 series · 14 of 28 positions shown · non-contrast
Comparison: none

[Series 1: us mfm ob follow-up · 14 of 36 slices shown]
[im 2/36]
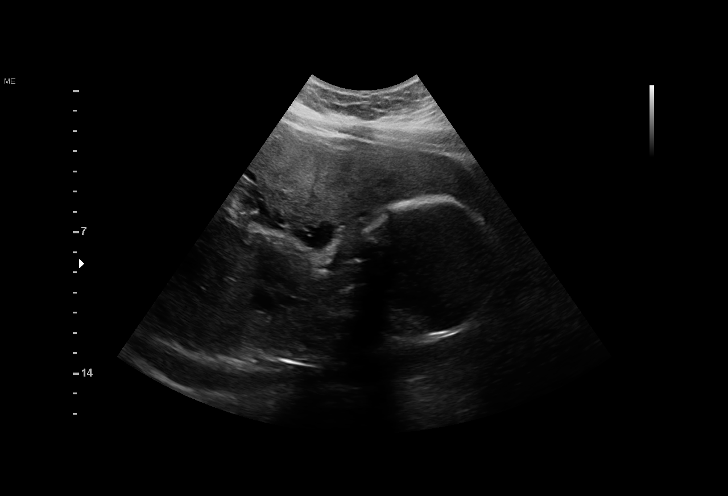
[im 4/36]
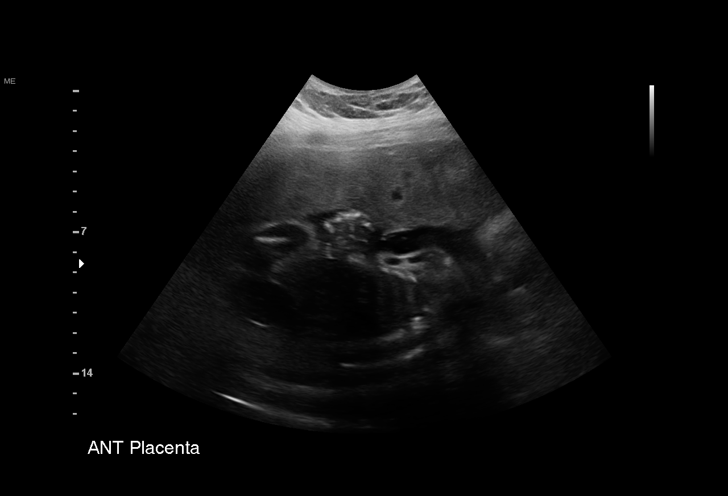
[im 7/36]
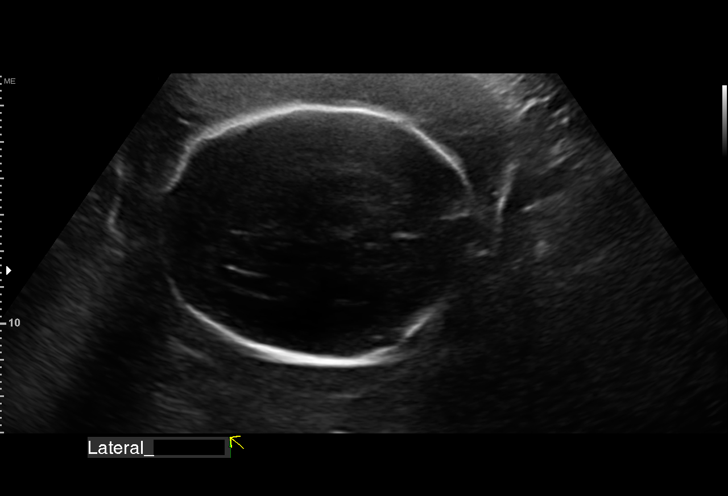
[im 10/36]
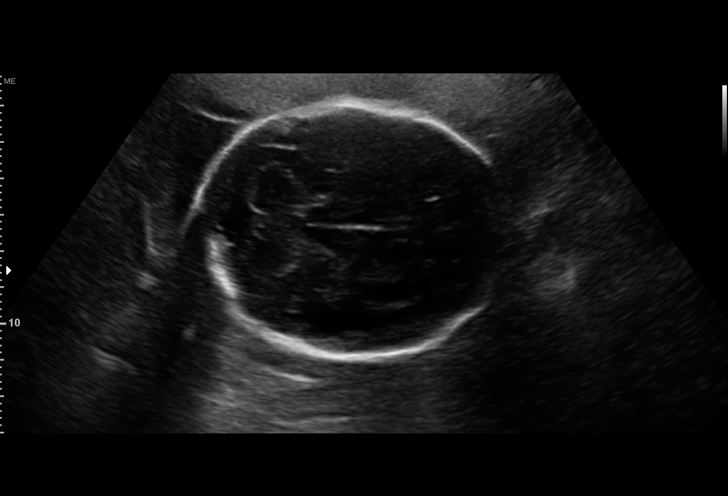
[im 12/36]
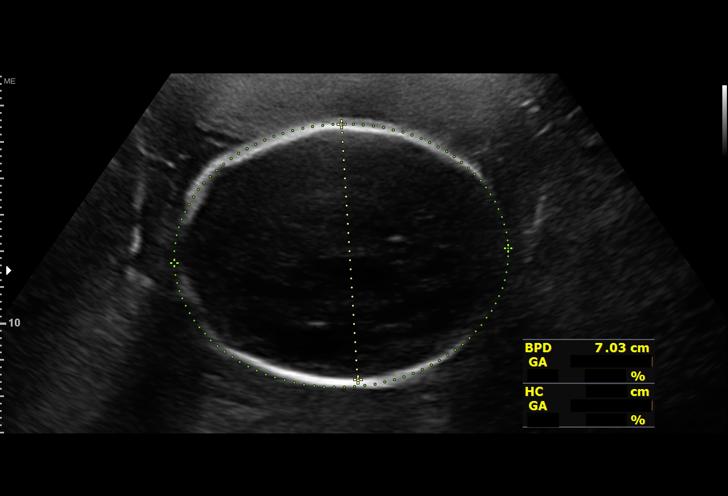
[im 15/36]
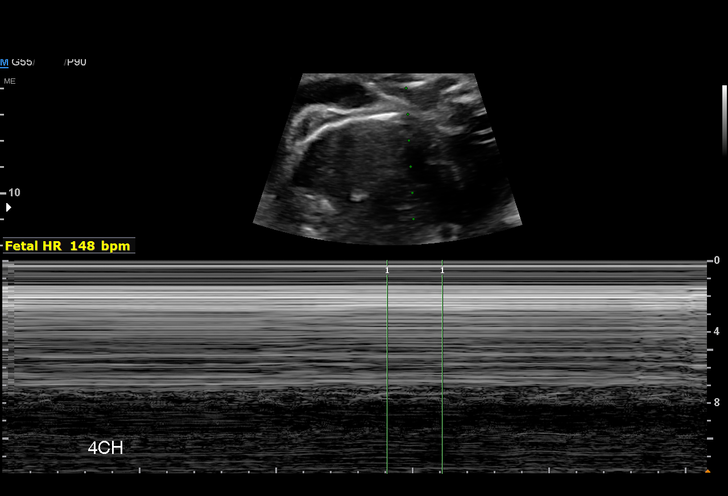
[im 17/36]
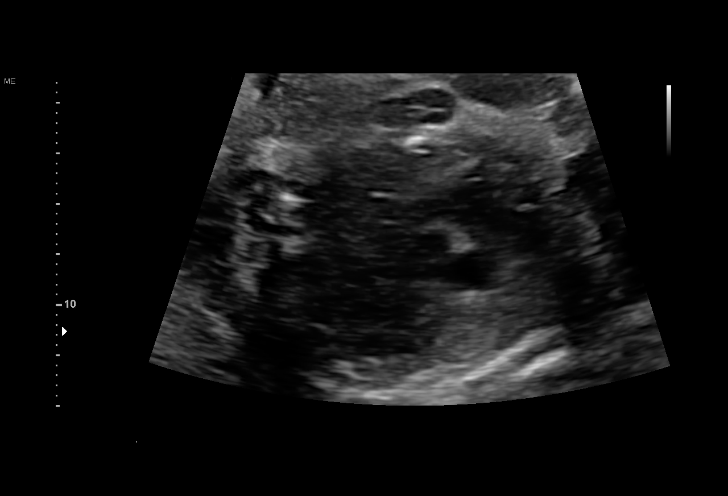
[im 20/36]
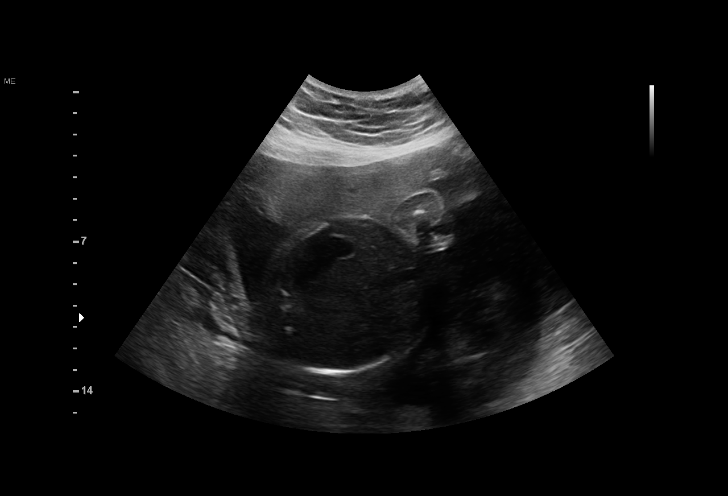
[im 23/36]
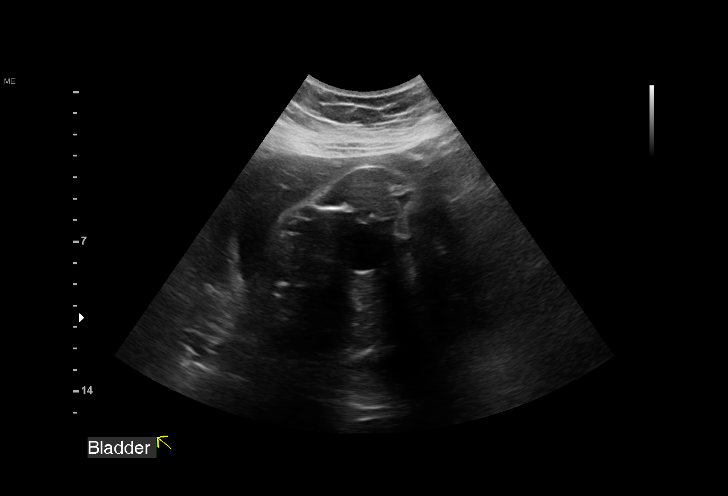
[im 25/36]
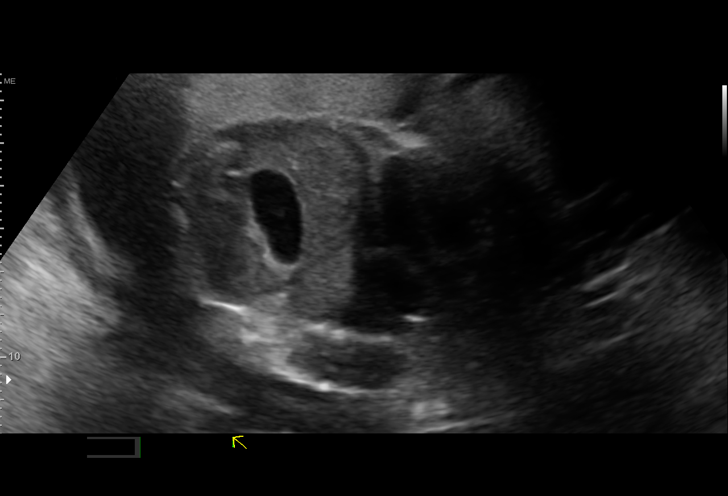
[im 28/36]
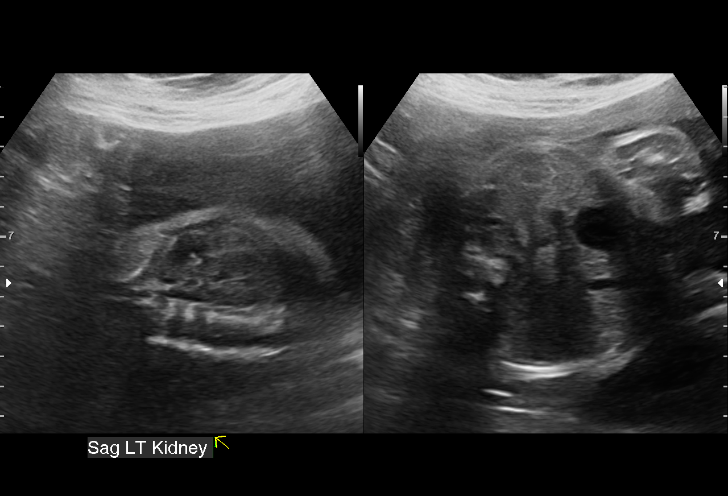
[im 30/36]
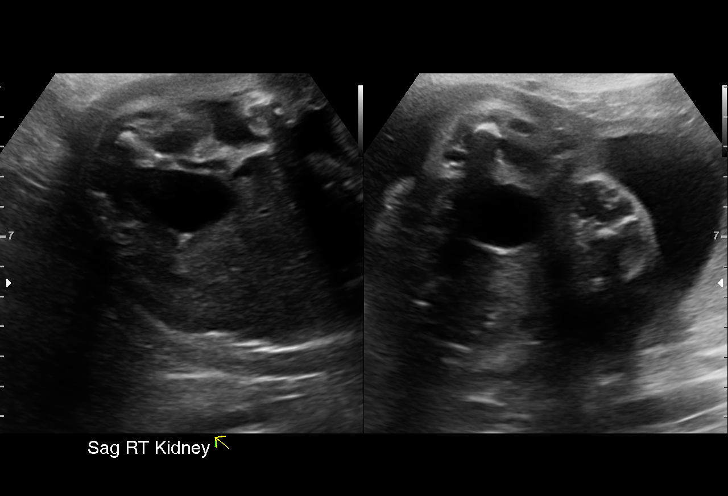
[im 33/36]
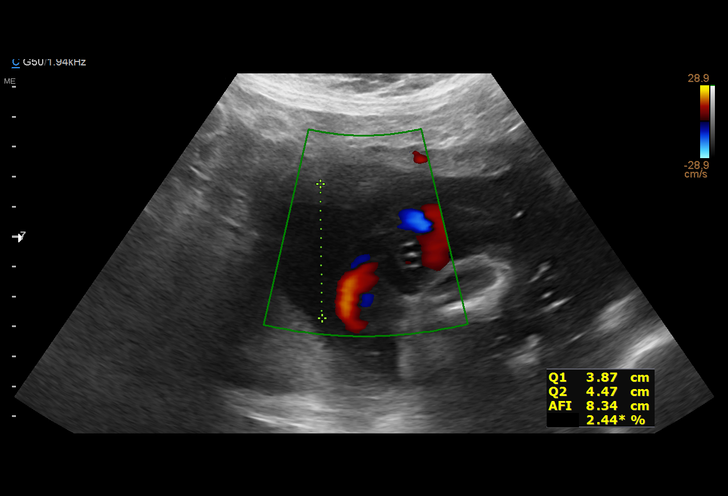
[im 36/36]
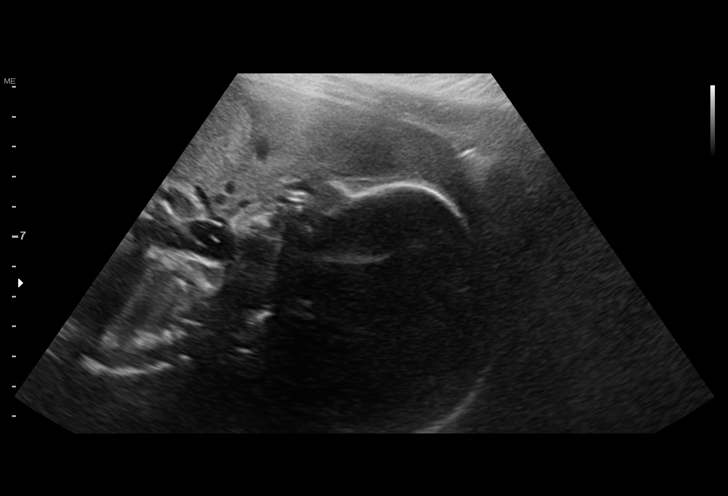

[14 of 28 positions shown; findings below may reference images not displayed]

JUNG[place]

Indications

 28 weeks gestation of pregnancy
 Hypertension - Chronic/Pre-existing
 Obesity complicating pregnancy, third
 trimester
 Gestational diabetes in pregnancy, insulin
 controlled
Fetal Evaluation

 Num Of Fetuses:         1
 Fetal Heart Rate(bpm):  148
 Cardiac Activity:       Observed
 Presentation:           Cephalic
 Placenta:               Anterior
 P. Cord Insertion:      Previously Visualized

 AFI Sum(cm)     %Tile       Largest Pocket(cm)
 12.4            31

 RUQ(cm)       RLQ(cm)       LUQ(cm)        LLQ(cm)

Biometry

 BPD:      70.7  mm     G. Age:  28w 3d         46  %    CI:         74.2   %    70 - 86
                                                         FL/HC:      20.5   %    18.8 -
 HC:      260.6  mm     G. Age:  28w 2d         25  %    HC/AC:      1.06        1.05 -
 AC:      246.2  mm     G. Age:  28w 6d         65  %    FL/BPD:     75.5   %    71 - 87
 FL:       53.4  mm     G. Age:  28w 2d         40  %    FL/AC:      21.7   %    20 - 24
 LV:        4.9  mm
 Est. FW:    9334  gm    2 lb 12 oz      55  %
Gestational Age

 LMP:           28w 1d        Date:  04/14/20                 EDD:   01/19/21
 Clinical EDD:  28w 1d                                        EDD:   01/19/21
 U/S Today:     28w 3d                                        EDD:   01/17/21
 Best:          28w 1d     Det. By:  LMP  (04/14/20)          EDD:   01/19/21
Anatomy

 Cranium:               Previously seen        Aortic Arch:            Previously seen
 Cavum:                 Appears normal         Ductal Arch:            Previously seen
 Ventricles:            Appears normal         Diaphragm:              Previously seen
 Choroid Plexus:        Previously seen        Stomach:                Appears normal, left
                                                                       sided
 Cerebellum:            Previously seen        Abdomen:                Previously seen
 Posterior Fossa:       Previously seen        Abdominal Wall:         Previously seen
 Nuchal Fold:           Previously seen        Cord Vessels:           Previously seen
 Face:                  Profile appears        Kidneys:                Appear normal
                        normal
 Lips:                  Previously seen        Bladder:                Appears normal
 Heart:                 Appears normal         Spine:                  Previously seen
                        (4CH, axis, and
                        situs)
 RVOT:                  Appears normal         Upper Extremities:      Previously seen
 LVOT:                  Previously seen        Lower Extremities:      Previously seen
Impression

 Follow up growth due to chronic hypertension and Type 2 DM
 Normal interval growth with measurements consistent with
 dates
 Good fetal movement and amniotic fluid volume

 Today Ms. Nisperuza blood pressure is stabl take Labetalol
 she takes 400 mg in the am and 200 mg in pm.  She reports
 that her glucose control taking NPH 38 u am and 44 in PM,
 Humulin 20 in am 14 in pm

 She continues on low dose ASA and denies signs or
 symptoms of preeclampsia.
Recommendations

 Follow up growth in 4 weeks
 Initiate weekly testing at 32 weeks.

## 2021-10-28 IMAGING — US US MFM OB FOLLOW-UP
2 series · 14 of 25 positions shown · non-contrast
Comparison: none

[Series 1: us mfm ob follow-up · 24 acquisitions, 13 frames shown (1 of 2)]
[im 1/24]
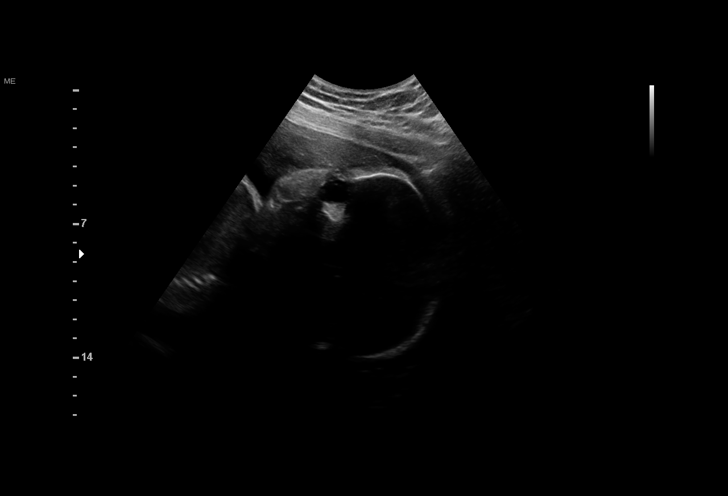
[im 3/24]
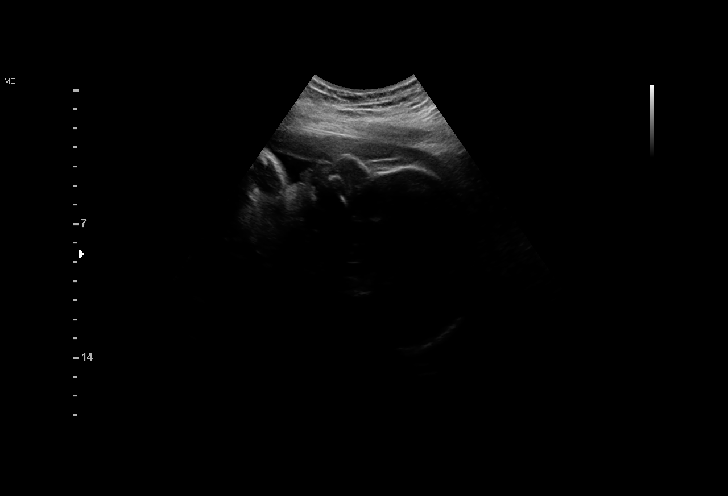
[im 5/24]
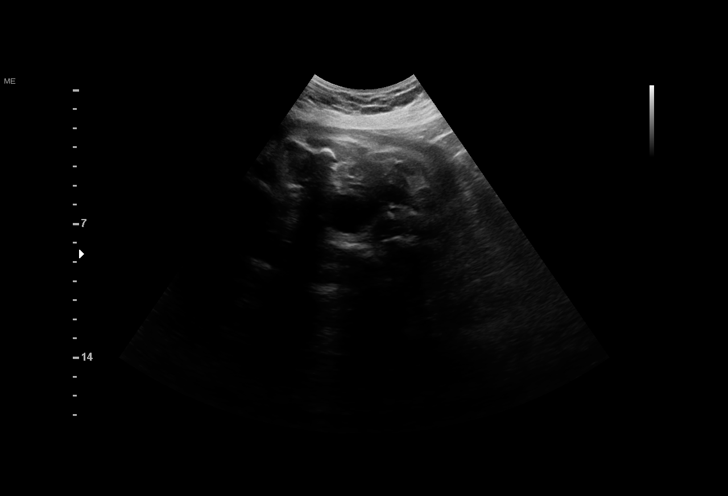
[im 7/24]
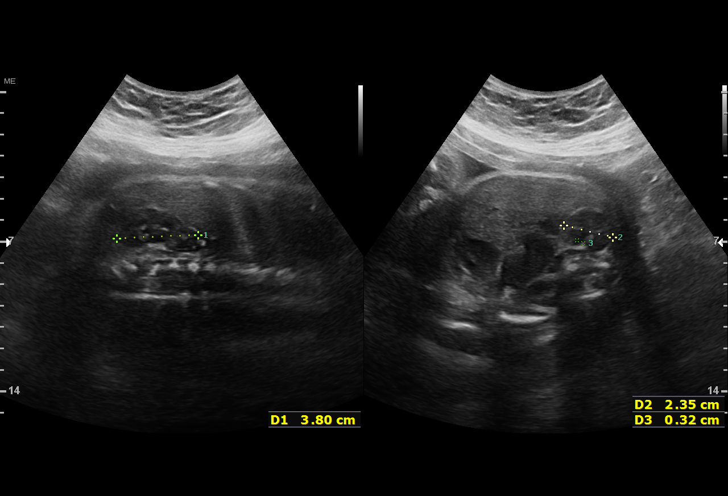
[im 9/24]
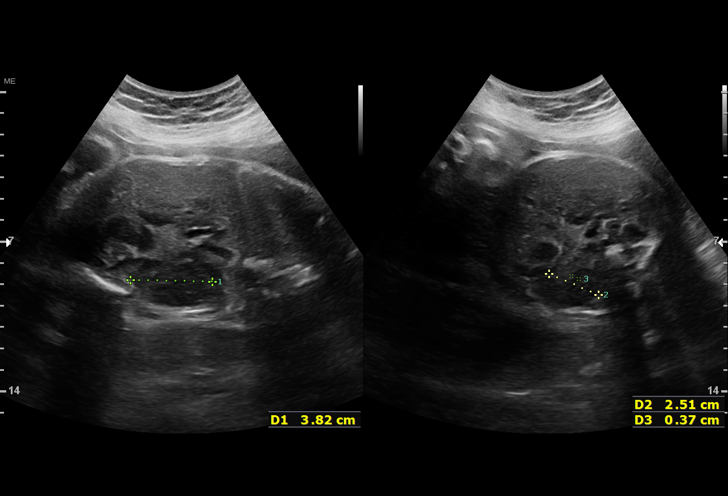
[im 10/24]
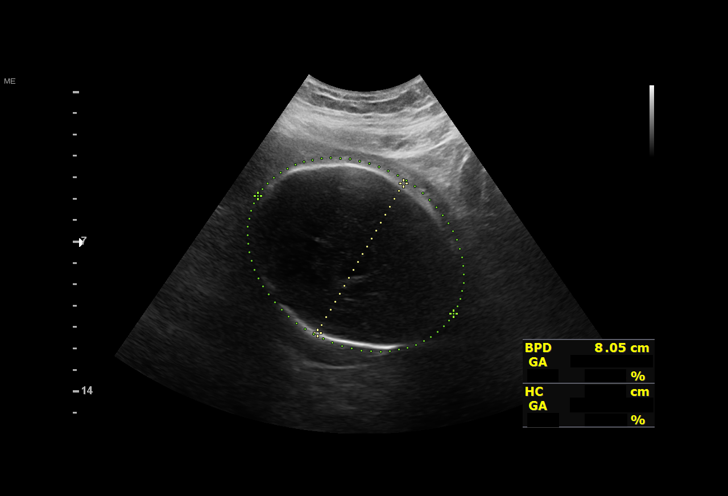
[im 12/24]
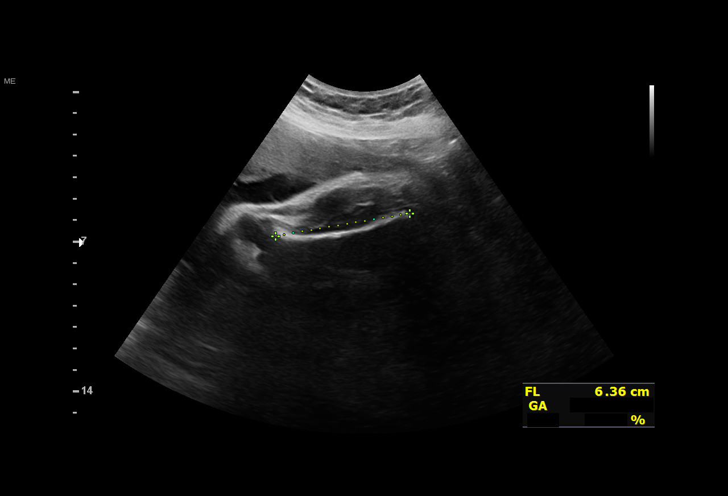
[im 14/24]
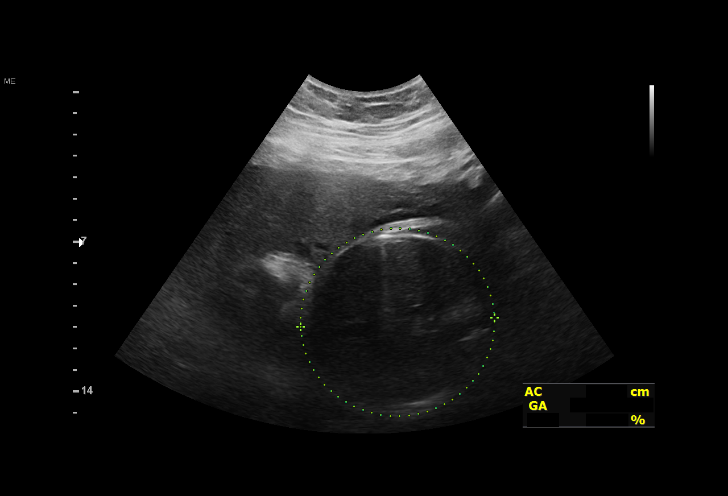
[im 16/24]
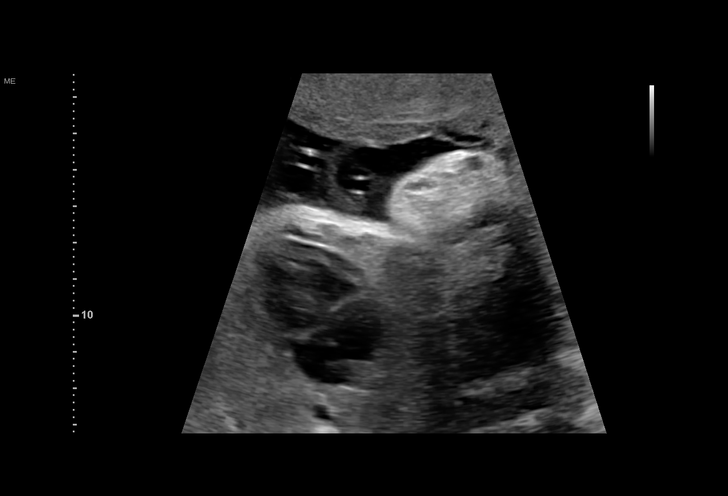
[im 17/24]
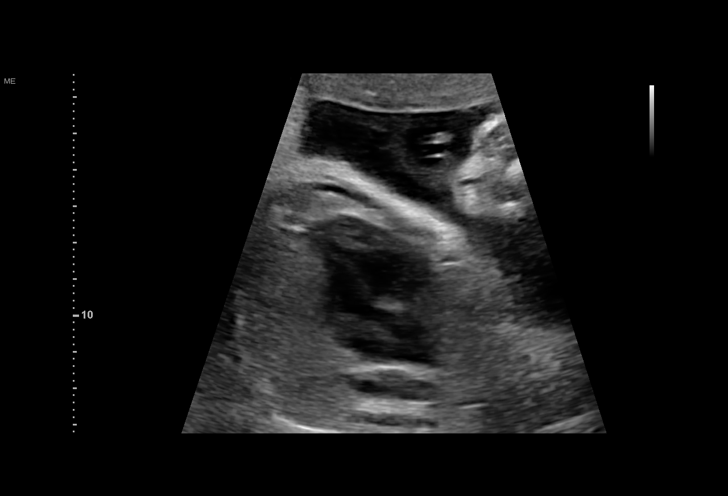
[im 19/24]
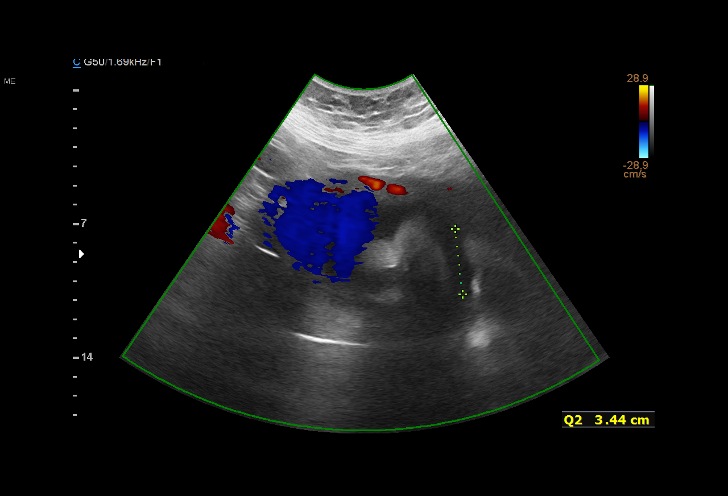
[im 21/24]
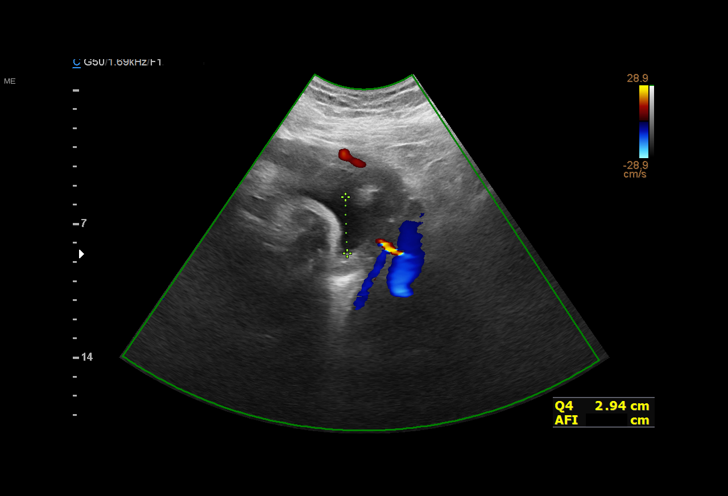
[im 23/24]
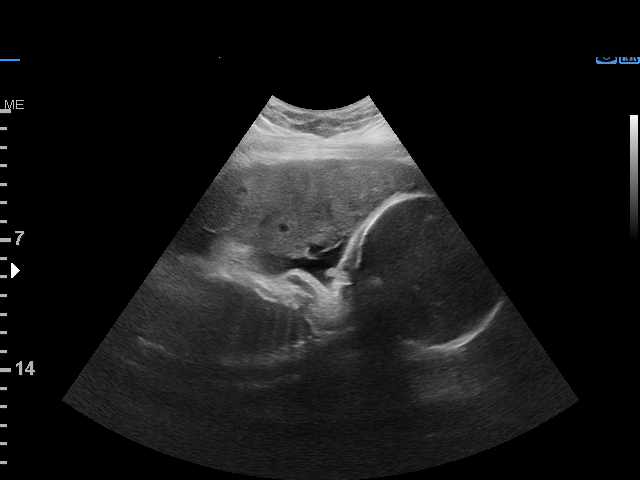

[Series 3: us mfm ob follow-up · 1 of 1 slices shown (2 of 2)]
[im 1/1]
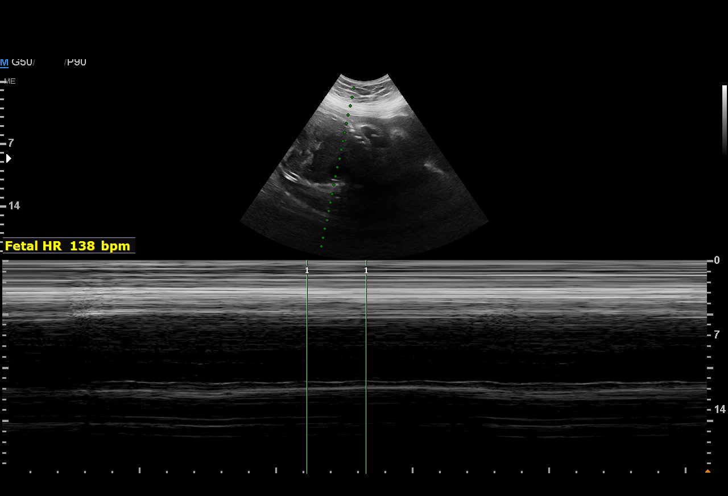

[14 of 25 positions shown; findings below may reference images not displayed]

[place]

Indications

 33 weeks gestation of pregnancy
 Obesity complicating pregnancy, third
 trimester
 Gestational diabetes in pregnancy, insulin
 controlled
 Pre-existing essential hypertension
 complicating pregnancy, third trimester
Fetal Evaluation

 Num Of Fetuses:          1
 Fetal Heart Rate(bpm):   138
 Cardiac Activity:        Observed
 Presentation:            Cephalic
 Placenta:                Anterior
 P. Cord Insertion:       Previously Visualized

 AFI Sum(cm)     %Tile       Largest Pocket(cm)
 10.1            19

 RUQ(cm)       RLQ(cm)       LUQ(cm)        LLQ(cm)

Biophysical Evaluation
 Amniotic F.V:   Within normal limits       F. Tone:         Observed
 F. Movement:    Observed                   Score:           [DATE]
 F. Breathing:   Observed
Biometry

 BPD:      80.1  mm     G. Age:  32w 1d         18  %    CI:        71.76   %    70 - 86
                                                         FL/HC:       21.4  %    19.9 -
 HC:        301  mm     G. Age:  33w 3d         20  %    HC/AC:       1.07       0.96 -
 AC:      280.2  mm     G. Age:  32w 0d         21  %    FL/BPD:      80.3  %    71 - 87
 FL:       64.3  mm     G. Age:  33w 1d         40  %    FL/AC:       22.9  %    20 - 24

 Est. FW:    9446   gm     4 lb 6 oz     24  %
Gestational Age

 LMP:           33w 1d        Date:  04/14/20                 EDD:   01/19/21
 Clinical EDD:  33w 1d                                        EDD:   01/19/21
 U/S Today:     32w 5d                                        EDD:   01/22/21
 Best:          33w 1d     Det. By:  LMP  (04/14/20)          EDD:   01/19/21
Anatomy

 Cranium:               Appears normal         LVOT:                   Previously seen
 Cavum:                 Previously seen        Aortic Arch:            Previously seen
 Ventricles:            Previously seen        Ductal Arch:            Previously seen
 Choroid Plexus:        Previously seen        Diaphragm:              Previously seen
 Cerebellum:            Previously seen        Stomach:                Appears normal, left
                                                                       sided
 Posterior Fossa:       Previously seen        Abdomen:                Previously seen
 Nuchal Fold:           Not applicable (>20    Abdominal Wall:         Previously seen
                        wks GA)
 Face:                  Profile appears        Cord Vessels:           Previously seen
                        normal
 Lips:                  Previously seen        Kidneys:                Appear normal
 Palate:                Previously seen        Bladder:                Appears normal
 Thoracic:              Previously seen        Spine:                  Previously seen
 Heart:                 Appears normal         Upper Extremities:      Previously seen
                        (4CH, axis, and
                        situs)
 RVOT:                  Previously seen        Lower Extremities:      Previously seen
Impression

 Follow up growth due chronic hypertension and [id number]
 Normal interval growth with measurements consistent with
 dates
 Good fetal movement and amniotic fluid volume
Recommendations

 Follow up growth in 4 weeks
 Continue testing weekly testing.

## 2021-11-12 DIAGNOSIS — Z760 Encounter for issue of repeat prescription: Secondary | ICD-10-CM | POA: Diagnosis not present

## 2021-11-12 DIAGNOSIS — H109 Unspecified conjunctivitis: Secondary | ICD-10-CM | POA: Diagnosis not present

## 2021-11-12 DIAGNOSIS — B9689 Other specified bacterial agents as the cause of diseases classified elsewhere: Secondary | ICD-10-CM | POA: Diagnosis not present

## 2021-11-12 DIAGNOSIS — Z8679 Personal history of other diseases of the circulatory system: Secondary | ICD-10-CM | POA: Diagnosis not present

## 2021-12-01 DIAGNOSIS — Z20818 Contact with and (suspected) exposure to other bacterial communicable diseases: Secondary | ICD-10-CM | POA: Diagnosis not present

## 2021-12-01 DIAGNOSIS — J020 Streptococcal pharyngitis: Secondary | ICD-10-CM | POA: Diagnosis not present

## 2022-01-19 DIAGNOSIS — N393 Stress incontinence (female) (male): Secondary | ICD-10-CM | POA: Diagnosis not present

## 2022-02-17 ENCOUNTER — Ambulatory Visit: Payer: Medicaid Other | Attending: Certified Nurse Midwife

## 2022-02-17 DIAGNOSIS — M6289 Other specified disorders of muscle: Secondary | ICD-10-CM | POA: Diagnosis not present

## 2022-02-17 DIAGNOSIS — M6281 Muscle weakness (generalized): Secondary | ICD-10-CM | POA: Insufficient documentation

## 2022-02-17 DIAGNOSIS — R278 Other lack of coordination: Secondary | ICD-10-CM | POA: Diagnosis not present

## 2022-02-17 NOTE — Therapy (Signed)
OUTPATIENT PHYSICAL THERAPY FEMALE PELVIC EVALUATION   Patient Name: Angelica Fisher MRN: [id number] DOB:1991-07-24, 31 y.o., female Today's Date: 02/18/2022   PT End of Session - 02/17/22 1528     Visit Number 1    Number of Visits 12    Date for PT Re-Evaluation 05/12/22    Authorization Type IE: 02/17/22    PT Start Time 1530    PT Stop Time 1612    PT Time Calculation (min) 42 min    Activity Tolerance Patient tolerated treatment well             Past Medical History:  Diagnosis Date   GERD (gastroesophageal reflux disease)    Gestational diabetes    History of kidney stones    Hypertension    PONV (postoperative nausea and vomiting)    Past Surgical History:  Procedure Laterality Date   CESAREAN SECTION  07/09/2010   CESAREAN SECTION WITH BILATERAL TUBAL LIGATION Bilateral 01/02/2021   Procedure: REPEAT CESAREAN SECTION WITH BILATERAL TUBAL LIGATION;  Surgeon: Suzy Bouchard, MD;  Location: [facility] ORS;  Service: Obstetrics;  Laterality: Bilateral;   TONSILLECTOMY     age 10   WISDOM TOOTH EXTRACTION     age 57   Patient Active Problem List   Diagnosis Date Noted   Previous cesarean delivery affecting pregnancy 01/02/2021   Post-operative state 01/02/2021   Indication for care in labor and delivery, antepartum 12/29/2020   Chronic hypertension affecting pregnancy 12/29/2020   Chronic hypertension during pregnancy, antepartum 12/24/2020   Non-reactive NST (non-stress test) 12/04/2020   Encounter for routine fetal ultrasound 09/30/2020   Pregnancy with uncertain fetal viability 09/30/2020   Essential hypertension 08/21/2020   Pedal edema 08/21/2020   SOB (shortness of breath) on exertion 08/21/2020   Supervision of high risk pregnancy in second trimester 07/07/2020    PCP: [facility]  REFERRING PROVIDER: Haroldine Laws, CNM   REFERRING DIAG:  N39.3 (ICD-10-CM) - Stress incontinence (female) (female)   THERAPY DIAG:  Other  lack of coordination  Pelvic floor dysfunction  Muscle weakness (generalized)  Rationale for Evaluation and Treatment: Rehabilitation  ONSET DATE: 11 years ago   RED FLAGS: N/A  Have you had any night sweats? Unexplained weight loss? Saddle anesthesia? Unexplained changes in bowel or bladder habits?   SUBJECTIVE: Patient confirms identification and approves PT to assess pelvic floor and treatment Yes                                                                                                                                                                                           PRECAUTIONS:  None  WEIGHT BEARING RESTRICTIONS: No  FALLS:  Has patient fallen in last 6 months? 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 Pt reports the urinary leakage can vary and be "a little" and "a lot." With first c-section Pt has a vertical scar and with second c-section Pt has a horizontal scar. Pt leaks with physical activity, coughing, sneezing, laughing, lifting, bending, squatting, taking care of younger child (1 yr old). Pt has had back pain since she was 12 and it became worse with pregnancy. Denies N/T on a regular but occasionally has during the night.    PAIN:  Are you having pain? Yes NPRS scale: 10/10 (worst), 3/10  Pain location: low back   Pain type: sharp, constant Pain description: sharp and stabbing   Aggravating factors: standing for long periods of time, lifting, caring for children  Relieving factors: heating pad, ice   LIVING ENVIRONMENT: Lives with: lives with their family Lives in: House/apartment   PATIENT GOALS: Pt would like to learn how to control bladder, not have to wear pantyliners or have leakage     UROLOGICAL  HISTORY Fluid intake: Yes: water all day, 1 soda a day, occasionally juice at night or milk    Pain with urination: No Fully empty bladder: Yes  Stream: strong Urgency: No Frequency: every 2-3 hrs Nocturia: 1x every now and then  Toileting posture: feet flat  Leakage: Coughing, Sneezing, Laughing, Exercise, Lifting, Bending forward, and Intercourse Pads: Yes Type: pantyliners, occasionally regular pads  Amount: every hour or two  Bladder control (0-10): 3/10  GASTROINTESTINAL HISTORY Pt has no concerns    SEXUAL HISTORY/FUNCTION Pain with intercourse: No   OBSTETRICAL HISTORY C-section deliveries: G3P2  Currently pregnant: No  GYNECOLOGICAL HISTORY Hysterectomy: no, tubal ligation  Pelvic Organ Prolapse: None Pain with exam: no Heaviness/pressure: no   OBJECTIVE:    COGNITION: Overall cognitive status: Within functional limits for tasks assessed     POSTURE:  In seated B rounded shoulders, posterior pelvic tilt in sitting   Lumbar lordosis:  Deferred 2/2 time constraints  Thoracic kyphosis: Iliac crest height:  Lumbar lateral shift:  Pelvic obliquity:  Leg length discrepancy:   GAIT: Deferred 2/2 time constraints   Distance walked:  Comments:   Trendelenburg:   SENSATION: Deferred 2/2 time constraints  Light touch: , L2-S2 dermatomes  Proprioception:    RANGE OF MOTION:  Deferred 2/2 time constraints   (Norm range in degrees)  LEFT  RIGHT   Lumbar forward flexion (65):      Lumbar extension (30):     Lumbar lateral flexion (25):     Thoracic and Lumbar rotation (30 degrees):       Hip Flexion (0-125):      Hip IR (0-45):     Hip ER (0-45):     Hip Adduction:      Hip Abduction (0-40):     Hip extension (0-15):     (*= pain, Blank rows = not tested)   STRENGTH: MMT  Deferred 2/2 time constraints   RLE  LLE   Hip Flexion    Hip Extension    Hip Abduction     Hip Adduction     Hip ER     Hip IR     Knee  Extension    Knee Flexion     Dorsiflexion     Plantarflexion (seated)    (*= pain, Blank rows = not tested)   SPECIAL TESTS: Deferred 2/2 time constraints  Centralization and Peripheralization (SN 92, -LR 0.12):  Slump (SN 83, -LR 0.32): R:  L:  SLR (SN 92, -LR 0.29): R:  L:   Lumbar quadrant (SN 70): R:  L:  FABER (SN 81): R:  L:  FADIR (SN 94): R:  L:  Hip scour (SN 50): R:  L:  Thigh Thrust (SN 88, -LR 0.18) : R:  L:  Distraction (HF02): R:  L:  Compression (SN/SP 69): R:  L:  Stork/March (SP 93): R:  L:    PHYSICAL PERFORMANCE MEASURES: Deferred 2/2 time constraints   STS:  RLE SLS:  LLE SLS:  6 MWT:  :  5TSTS:   PALPATION: Deferred 2/2 time constraints  Abdominal:  Diastasis:  finger above umbilicus,  fingers at and below umbilicus  Scar mobility: present/mobile perpendicular, parallel Rib flare: present/absent  EXTERNAL PELVIC EXAM: Patient educated on the purpose of the pelvic exam and articulated understanding; patient consented to the exam verbally. Deferred 2/2 time constraints  Palpation: Breath coordination: present/absent/inconsistent Voluntary Contraction: present/absent Relaxation: full/delayed/non-relaxing Perineal movement with sustained IAP increase ("bear down"): descent/no change/elevation/excessive descent Perineal movement with rapid IAP increase ("cough"): elevation/no change/descent Pubic symphysis: (0= no contraction, 1= flicker, 2= weak squeeze, 3= fair squeeze with lift, 4= good squeeze and lift against resistance, 5= strong squeeze against strong resistance)   INTERNAL PELVIC EXAM: Patient educated on the purpose of the pelvic exam and articulated understanding; patient consented to the exam verbally. Not applicable to Pt at this time.  Introitus Appears:  Skin integrity:  Scar mobility: Strength (PERF):  Symmetry: Palpation: Prolapse: (0= no contraction, 1= flicker, 2= weak squeeze, 3= fair squeeze with lift, 4= good squeeze and lift against resistance, 5=  strong squeeze against strong resistance)    Patient Education:  Patient educated on what to expect during course of physical therapy, POC, and provided with HEP including: toileting posture handout and scar massage techniques handout. Patient verbalized understanding and returned demonstration. Patient will benefit from further education in order to maximize compliance and understanding for long-term therapeutic gains   Patient Surveys:  FOTO Urinary Problem -50 FOTO Lumbar Spine - will complete next visit      ASSESSMENT:  Clinical Impression: Patient is a 31 y.o. who was seen today for physical therapy evaluation and treatment for a chief concern of urinary leakage. Today's evaluation suggest deficits in IAP management, PFM coordination, PFM strength, PFM endurance, posture, pain, and scar mobility as evidenced by constant sharp/stabbing worst LBP 10/10 (NPRS), currently 3/10 LBP in sitting, urinary leakage with coughing/laughing/exercise/caring for others/bending/lifting/sexual intercourse, wearing pads (pantyliners but has uesd heavier) for leakage and changing up to every hour, hx of 2 c-sections, and B rounded shoulders in sitting with a posterior pelvic tilt. Patient's responses on FOTO Urinary Problem (50) indicates moderate limitation/disability/distress. Patient's progress may be limited due to time since onset; however, patient's motivation is advantageous. Pt with basic understanding of PFM in bowel/bladder habits, posture, the deep core, and sexual function. Patient will benefit from skilled therapeutic intervention to address deficits in IAP management, PFM coordination, PFM strength, PFM endurance, posture, pain in order to increase PLOF and improve overall QOL.    Objective Impairments: decreased activity tolerance, decreased coordination, decreased endurance, decreased strength, increased fascial restrictions, improper body mechanics, postural dysfunction, and  pain.   Activity  Limitations: carrying, lifting, bending, sitting, standing, squatting, continence, toileting, locomotion level, and caring for others  Personal Factors: Age, Behavior pattern, and Past/current experiences are also affecting patient's functional outcome.   Rehab Potential: Good  Clinical Decision Making: Evolving/moderate complexity  Evaluation Complexity: Moderate   GOALS: Goals reviewed with patient? Yes  SHORT TERM GOALS: Target date: 04/01/2022  Patient will demonstrate independence with HEP in order to maximize therapeutic gains and improve carryover from physical therapy sessions to ADLs in the home and community. Baseline: scar massage handout/toileting posture Goal status: INITIAL    LONG TERM GOALS: Target date: 05/13/2022   Patient will score  >/= 61 on FOTO Urinary Problem and demonstrate a change by at least 5 points in FOTO Lumbar Spine in order to demonstrate decreased pain, improved PFM coordination, improved IAP management and overall improved QOL.  Baseline: Urinary-50, Lumbar-next visit  Goal status: INITIAL  2.  Patient will report less than 5 incidents of stress urinary incontinence over the course of 3 weeks while coughing/sneezing/laughing/prolonged activity/sexual intercourse in order to demonstrate improved PFM coordination, strength, and function for improved overall QOL. Baseline: leakage with all the above  Goal status: INITIAL  3.  Patient will report decreased reliance on protective undergarments as indicated by a 24 hour period to demonstrate improved bladder control and allow for increased participation in activities outside of the home. Baseline: changing pad or liner every 1-2 hrs Goal status: INITIAL  4.  Patient will report confidence in ability to control bladder > 7/10 in order to demonstrate improved function and ability to participate more fully in activities at home and in the community. Baseline: 3/10 Goal status: INITIAL  5.  Patient will  demonstrate independent and coordinated diaphragmatic breathing in supine with a 1:2 breathing pattern for improved down-regulation of the nervous system and improved management of intra-abdominal pressures in order to increase function at home and in the community. Baseline: will assess next visit  Goal status: INITIAL  6.  Patient will decrease worst pain as reported on NPRS by at least 2 points to demonstrate clinically significant reduction in pain in order to restore/improve function and overall QOL. Baseline: 10/10 LBP Goal status: INITIAL  PLAN: PT Frequency: 1x/week  PT Duration: 12 weeks  Planned Interventions: Therapeutic exercises, Therapeutic activity, Neuromuscular re-education, Balance training, Gait training, Patient/Family education, Self Care, Joint mobilization, Spinal mobilization, Cryotherapy, Moist heat, scar mobilization, Taping, and Manual therapy  Plan For Next Session: phys assess, FOTO lumbar spine   Frances Ambrosino, PT, DPT  02/18/2022, 11:19 AM

## 2022-02-24 ENCOUNTER — Ambulatory Visit: Payer: Medicaid Other

## 2022-03-03 ENCOUNTER — Ambulatory Visit: Payer: Medicaid Other

## 2022-03-09 DIAGNOSIS — Z1321 Encounter for screening for nutritional disorder: Secondary | ICD-10-CM | POA: Diagnosis not present

## 2022-03-09 DIAGNOSIS — Z130 Encounter for screening for diseases of the blood and blood-forming organs and certain disorders involving the immune mechanism: Secondary | ICD-10-CM | POA: Diagnosis not present

## 2022-03-09 DIAGNOSIS — Z01419 Encounter for gynecological examination (general) (routine) without abnormal findings: Secondary | ICD-10-CM | POA: Diagnosis not present

## 2022-03-09 DIAGNOSIS — Z113 Encounter for screening for infections with a predominantly sexual mode of transmission: Secondary | ICD-10-CM | POA: Diagnosis not present

## 2022-03-09 DIAGNOSIS — Z114 Encounter for screening for human immunodeficiency virus [HIV]: Secondary | ICD-10-CM | POA: Diagnosis not present

## 2022-03-09 DIAGNOSIS — Z131 Encounter for screening for diabetes mellitus: Secondary | ICD-10-CM | POA: Diagnosis not present

## 2022-03-09 DIAGNOSIS — Z13220 Encounter for screening for lipoid disorders: Secondary | ICD-10-CM | POA: Diagnosis not present

## 2022-03-10 ENCOUNTER — Ambulatory Visit: Payer: Medicaid Other | Attending: Certified Nurse Midwife

## 2022-03-12 ENCOUNTER — Telehealth: Payer: Self-pay

## 2022-03-12 NOTE — Telephone Encounter (Signed)
Angelica Fisher called and spoke with patient, about continuing with outpatient pelvic floor PT.  Patient stated due to work she cannot come currently. Confirmed with patient once she gets her work schedule figured out and she can commit to coming she will call back to r/s appts and Angelica Fisher will obtain new auth through [facility] Medicaid

## 2022-03-17 ENCOUNTER — Ambulatory Visit: Payer: Medicaid Other

## 2022-03-24 ENCOUNTER — Ambulatory Visit: Payer: Medicaid Other

## 2022-04-22 DIAGNOSIS — J069 Acute upper respiratory infection, unspecified: Secondary | ICD-10-CM | POA: Diagnosis not present

## 2022-04-22 DIAGNOSIS — R0989 Other specified symptoms and signs involving the circulatory and respiratory systems: Secondary | ICD-10-CM | POA: Diagnosis not present

## 2022-04-22 DIAGNOSIS — Z20822 Contact with and (suspected) exposure to covid-19: Secondary | ICD-10-CM | POA: Diagnosis not present

## 2022-06-08 DIAGNOSIS — Z113 Encounter for screening for infections with a predominantly sexual mode of transmission: Secondary | ICD-10-CM | POA: Diagnosis not present

## 2022-06-08 DIAGNOSIS — N921 Excessive and frequent menstruation with irregular cycle: Secondary | ICD-10-CM | POA: Diagnosis not present

## 2022-06-08 DIAGNOSIS — N898 Other specified noninflammatory disorders of vagina: Secondary | ICD-10-CM | POA: Diagnosis not present

## 2022-06-08 DIAGNOSIS — Z114 Encounter for screening for human immunodeficiency virus [HIV]: Secondary | ICD-10-CM | POA: Diagnosis not present

## 2022-06-08 DIAGNOSIS — A5901 Trichomonal vulvovaginitis: Secondary | ICD-10-CM | POA: Diagnosis not present

## 2022-08-23 DIAGNOSIS — N920 Excessive and frequent menstruation with regular cycle: Secondary | ICD-10-CM | POA: Diagnosis not present

## 2022-08-23 DIAGNOSIS — B9689 Other specified bacterial agents as the cause of diseases classified elsewhere: Secondary | ICD-10-CM | POA: Diagnosis not present

## 2022-08-23 DIAGNOSIS — Z8619 Personal history of other infectious and parasitic diseases: Secondary | ICD-10-CM | POA: Diagnosis not present

## 2022-08-23 DIAGNOSIS — N760 Acute vaginitis: Secondary | ICD-10-CM | POA: Diagnosis not present

## 2022-08-31 DIAGNOSIS — Z1389 Encounter for screening for other disorder: Secondary | ICD-10-CM | POA: Diagnosis not present

## 2022-08-31 DIAGNOSIS — Z124 Encounter for screening for malignant neoplasm of cervix: Secondary | ICD-10-CM | POA: Diagnosis not present

## 2022-08-31 DIAGNOSIS — Z0131 Encounter for examination of blood pressure with abnormal findings: Secondary | ICD-10-CM | POA: Diagnosis not present

## 2022-08-31 DIAGNOSIS — N920 Excessive and frequent menstruation with regular cycle: Secondary | ICD-10-CM | POA: Diagnosis not present

## 2022-08-31 DIAGNOSIS — I10 Essential (primary) hypertension: Secondary | ICD-10-CM | POA: Diagnosis not present

## 2022-09-29 DIAGNOSIS — N920 Excessive and frequent menstruation with regular cycle: Secondary | ICD-10-CM | POA: Diagnosis not present

## 2022-09-29 DIAGNOSIS — Z23 Encounter for immunization: Secondary | ICD-10-CM | POA: Diagnosis not present

## 2022-10-04 DIAGNOSIS — Z1331 Encounter for screening for depression: Secondary | ICD-10-CM | POA: Diagnosis not present

## 2022-10-04 DIAGNOSIS — Z131 Encounter for screening for diabetes mellitus: Secondary | ICD-10-CM | POA: Diagnosis not present

## 2022-10-04 DIAGNOSIS — Z13220 Encounter for screening for lipoid disorders: Secondary | ICD-10-CM | POA: Diagnosis not present

## 2022-10-04 DIAGNOSIS — Z0131 Encounter for examination of blood pressure with abnormal findings: Secondary | ICD-10-CM | POA: Diagnosis not present

## 2022-10-04 DIAGNOSIS — Z1159 Encounter for screening for other viral diseases: Secondary | ICD-10-CM | POA: Diagnosis not present

## 2022-10-04 DIAGNOSIS — I10 Essential (primary) hypertension: Secondary | ICD-10-CM | POA: Diagnosis not present

## 2022-11-16 DIAGNOSIS — N920 Excessive and frequent menstruation with regular cycle: Secondary | ICD-10-CM | POA: Diagnosis not present

## 2022-11-16 DIAGNOSIS — W448XXA Other foreign body entering into or through a natural orifice, initial encounter: Secondary | ICD-10-CM | POA: Diagnosis not present

## 2022-11-16 DIAGNOSIS — Z3202 Encounter for pregnancy test, result negative: Secondary | ICD-10-CM | POA: Diagnosis not present

## 2022-11-16 DIAGNOSIS — T192XXA Foreign body in vulva and vagina, initial encounter: Secondary | ICD-10-CM | POA: Diagnosis not present

## 2022-11-24 DIAGNOSIS — Z0131 Encounter for examination of blood pressure with abnormal findings: Secondary | ICD-10-CM | POA: Diagnosis not present

## 2022-11-24 DIAGNOSIS — B370 Candidal stomatitis: Secondary | ICD-10-CM | POA: Diagnosis not present

## 2022-11-24 DIAGNOSIS — Z1389 Encounter for screening for other disorder: Secondary | ICD-10-CM | POA: Diagnosis not present

## 2022-11-26 DIAGNOSIS — Z23 Encounter for immunization: Secondary | ICD-10-CM | POA: Diagnosis not present

## 2023-03-25 DIAGNOSIS — Z23 Encounter for immunization: Secondary | ICD-10-CM | POA: Diagnosis not present

## 2023-08-23 ENCOUNTER — Emergency Department: Payer: Medicaid Other

## 2023-08-23 ENCOUNTER — Emergency Department
Admission: EM | Admit: 2023-08-23 | Discharge: 2023-08-23 | Disposition: A | Discharge status: Home / Self Care | Payer: Medicaid Other | Attending: Emergency Medicine | Admitting: Emergency Medicine

## 2023-08-23 ENCOUNTER — Other Ambulatory Visit: Payer: Self-pay

## 2023-08-23 DIAGNOSIS — R109 Unspecified abdominal pain: Secondary | ICD-10-CM | POA: Diagnosis not present

## 2023-08-23 DIAGNOSIS — Z87442 Personal history of urinary calculi: Secondary | ICD-10-CM | POA: Insufficient documentation

## 2023-08-23 DIAGNOSIS — K429 Umbilical hernia without obstruction or gangrene: Secondary | ICD-10-CM | POA: Diagnosis not present

## 2023-08-23 DIAGNOSIS — R1032 Left lower quadrant pain: Secondary | ICD-10-CM | POA: Insufficient documentation

## 2023-08-23 LAB — COMPREHENSIVE METABOLIC PANEL
ALT: 16 U/L (ref 0–44)
ALT: 16 U/L (ref 0–44)
AST: 18 U/L (ref 15–41)
AST: 17 U/L (ref 15–41)
Albumin: 3.2 g/dL — ABNORMAL LOW (ref 3.5–5.0)
Albumin: 3.5 g/dL (ref 3.5–5.0)
Alkaline Phosphatase: 90 U/L (ref 38–126)
Alkaline Phosphatase: 58 U/L (ref 38–126)
Anion gap: 10 (ref 5–15)
Anion gap: 7 (ref 5–15)
BUN: 39 mg/dL — ABNORMAL HIGH (ref 6–20)
BUN: 13 mg/dL (ref 6–20)
CO2: 20 mmol/L — ABNORMAL LOW (ref 22–32)
CO2: 28 mmol/L (ref 22–32)
Calcium: 9.2 mg/dL (ref 8.9–10.3)
Calcium: 8.7 mg/dL — ABNORMAL LOW (ref 8.9–10.3)
Chloride: 112 mmol/L — ABNORMAL HIGH (ref 98–111)
Chloride: 104 mmol/L (ref 98–111)
Creatinine, Ser: 1.85 mg/dL — ABNORMAL HIGH (ref 0.44–1.00)
Creatinine, Ser: 0.64 mg/dL (ref 0.44–1.00)
GFR, Estimated: 37 mL/min — ABNORMAL LOW (ref >60)
GFR, Estimated: >60 mL/min (ref >60)
Glucose, Bld: 104 mg/dL — ABNORMAL HIGH (ref 70–99)
Glucose, Bld: 186 mg/dL — ABNORMAL HIGH (ref 70–99)
Potassium: 5.1 mmol/L (ref 3.5–5.1)
Potassium: 4.4 mmol/L (ref 3.5–5.1)
Sodium: 142 mmol/L (ref 135–145)
Sodium: 139 mmol/L (ref 135–145)
Total Bilirubin: 1.1 mg/dL (ref 0.0–1.2)
Total Bilirubin: 0.4 mg/dL (ref 0.0–1.2)
Total Protein: 6.7 g/dL (ref 6.5–8.1)
Total Protein: 7 g/dL (ref 6.5–8.1)

## 2023-08-23 LAB — URINALYSIS, ROUTINE W REFLEX MICROSCOPIC
Bacteria, UA: NONE SEEN
Bilirubin Urine: NEGATIVE
Glucose, UA: NEGATIVE mg/dL
Ketones, ur: NEGATIVE mg/dL
Leukocytes,Ua: NEGATIVE
Nitrite: NEGATIVE
Protein, ur: NEGATIVE mg/dL
Specific Gravity, Urine: 1.024 (ref 1.005–1.030)
pH: 5 (ref 5.0–8.0)

## 2023-08-23 LAB — CBC
HCT: 50 % — ABNORMAL HIGH (ref 36.0–46.0)
Hemoglobin: 16.4 g/dL — ABNORMAL HIGH (ref 12.0–15.0)
MCH: 29.1 pg (ref 26.0–34.0)
MCHC: 32.8 g/dL (ref 30.0–36.0)
MCV: 88.8 fL (ref 80.0–100.0)
Platelets: 259 10*3/uL (ref 150–400)
RBC: 5.63 MIL/uL — ABNORMAL HIGH (ref 3.87–5.11)
RDW: 13.5 % (ref 11.5–15.5)
WBC: 10.9 10*3/uL — ABNORMAL HIGH (ref 4.0–10.5)
nRBC: 0 % (ref 0.0–0.2)

## 2023-08-23 LAB — POC URINE PREG, ED: Preg Test, Ur: NEGATIVE

## 2023-08-23 LAB — LIPASE, BLOOD
Lipase: 29 U/L (ref 11–51)
Lipase: 32 U/L (ref 11–51)

## 2023-08-23 MED ORDER — KETOROLAC TROMETHAMINE 30 MG/ML IJ SOLN
30.0000 mg | Freq: Once | INTRAMUSCULAR | Status: AC
  Administered 2023-08-23: 30 mg via INTRAMUSCULAR
  Filled 2023-08-23: qty 1

## 2023-08-23 NOTE — ED Triage Notes (Signed)
 Pt to ED for lower abd pain started this am. +nausea. Hx fibroids. Denies urinary sx. Last BM this am.

## 2023-08-23 NOTE — ED Provider Notes (Signed)
 [facility] Provider Note    Event Date/Time   First MD Initiated Contact with Patient 08/23/23 1140     (approximate)   History   Abdominal Pain   HPI  Angelica Fisher is a 33 y.o. female with a history of GERD, kidney stones who presents with complaints of left lower abdominal pain which started abruptly this morning.  It was severe but has improved significantly since then.  No fevers or chills.  No dysuria.  No vaginal discharge      Physical Exam   Triage Vital Signs: ED Triage Vitals [08/23/23 0817]  Encounter Vitals Group     BP (!) 157/104     Systolic BP Percentile      Diastolic BP Percentile      Pulse Rate 89     Resp 20     Temp 98 F (36.7 C)     Temp src      SpO2 100 %     Weight 104.3 kg (230 lb)     Height 1.753 m (5' 9)     Head Circumference      Peak Flow      Pain Score 10     Pain Loc      Pain Education      Exclude from Growth Chart     Most recent vital signs: Vitals:   08/23/23 0817  BP: (!) 157/104  Pulse: 89  Resp: 20  Temp: 98 F (36.7 C)  SpO2: 100%     General: Awake, no distress.  CV:  Good peripheral perfusion.  Resp:  Normal effort.  Abd:  No distention.  Soft, nontender, no CVA tenderness Other:     ED Results / Procedures / Treatments   Labs (all labs ordered are listed, but only abnormal results are displayed) Labs Reviewed  COMPREHENSIVE METABOLIC PANEL - Abnormal; Notable for the following components:      Result Value   Chloride 112 (*)    CO2 20 (*)    Glucose, Bld 104 (*)    BUN 39 (*)    Creatinine, Ser 1.85 (*)    Albumin 3.2 (*)    GFR, Estimated 37 (*)    All other components within normal limits  CBC - Abnormal; Notable for the following components:   WBC 10.9 (*)    RBC 5.63 (*)    Hemoglobin 16.4 (*)    HCT 50.0 (*)    All other components within normal limits  URINALYSIS, ROUTINE W REFLEX MICROSCOPIC - Abnormal; Notable for the following components:    Color, Urine YELLOW (*)    APPearance CLEAR (*)    Hgb urine dipstick SMALL (*)    All other components within normal limits  COMPREHENSIVE METABOLIC PANEL - Abnormal; Notable for the following components:   Glucose, Bld 186 (*)    Calcium  8.7 (*)    All other components within normal limits  LIPASE, BLOOD  LIPASE, BLOOD  POC URINE PREG, ED     EKG     RADIOLOGY CT renal stone study viewed interpret by me, no ureterolithiasis noted, pending radiology review    PROCEDURES:  Critical Care performed:   Procedures   MEDICATIONS ORDERED IN ED: Medications  ketorolac  (TORADOL ) 30 MG/ML injection 30 mg (30 mg Intramuscular Given 08/23/23 1201)     IMPRESSION / MDM / ASSESSMENT AND PLAN / ED COURSE  I reviewed the triage vital signs and the nursing notes. Patient's presentation  is most consistent with acute presentation with potential threat to life or bodily function.  Patient presents with left lower quadrant abdominal pain, differential includes diverticulitis, ureterolithiasis, UTI  Lab work is overall reassuring, will treat with IM Toradol , obtain CT scan to evaluate for ureterolithiasis/diverticulitis  CT scan is reassuring, patient is feeling much better, she is pain-free, no indication for admission at this time, appropriate for discharge, strict return precautions discussed, she agrees with this plan.        FINAL CLINICAL IMPRESSION(S) / ED DIAGNOSES   Final diagnoses:  Left lower quadrant abdominal pain     Rx / DC Orders   ED Discharge Orders     None        Note:  This document was prepared using Dragon voice recognition software and may include unintentional dictation errors.   Arlander Charleston, MD 08/23/23 [phone]

## 2023-08-23 NOTE — ED Provider Triage Note (Signed)
 Emergency Medicine Provider Triage Evaluation Note  Angelica Fisher , a 33 y.o. female  was evaluated in triage.  Pt complains of abdominal pain that started this am. Initial pain left lower quadrant and now radiating to right side.  Normal BM this am.    Review of Systems  Positive: LLQ pain Negative: No fever, N,V, D.  Physical Exam  There were no vitals taken for this visit. Gen:   Awake, obvious distress  and uncomfortable.  Resp:  Normal effort  Lungs clear bilat.  MSK:   Moves extremities without difficulty  Other:  Moderate tenderness LLQ, BS present.    Medical Decision Making  Medically screening exam initiated at 8:16 AM.  Appropriate orders placed.  Angelica Fisher was informed that the remainder of the evaluation will be completed by another provider, this initial triage assessment does not replace that evaluation, and the importance of remaining in the ED until their evaluation is complete.     Saunders Shona CROME, PA-C 08/23/23 [phone]

## 2023-11-08 ENCOUNTER — Emergency Department
Admission: EM | Admit: 2023-11-08 | Discharge: 2023-11-08 | Disposition: A | Discharge status: Home / Self Care | Attending: Emergency Medicine | Admitting: Emergency Medicine

## 2023-11-08 ENCOUNTER — Encounter: Payer: Self-pay | Admitting: Emergency Medicine

## 2023-11-08 ENCOUNTER — Emergency Department

## 2023-11-08 ENCOUNTER — Other Ambulatory Visit: Payer: Self-pay

## 2023-11-08 DIAGNOSIS — R162 Hepatomegaly with splenomegaly, not elsewhere classified: Secondary | ICD-10-CM | POA: Diagnosis not present

## 2023-11-08 DIAGNOSIS — R1031 Right lower quadrant pain: Secondary | ICD-10-CM | POA: Insufficient documentation

## 2023-11-08 DIAGNOSIS — R110 Nausea: Secondary | ICD-10-CM | POA: Diagnosis not present

## 2023-11-08 DIAGNOSIS — K429 Umbilical hernia without obstruction or gangrene: Secondary | ICD-10-CM | POA: Diagnosis not present

## 2023-11-08 DIAGNOSIS — D72829 Elevated white blood cell count, unspecified: Secondary | ICD-10-CM | POA: Insufficient documentation

## 2023-11-08 DIAGNOSIS — R1011 Right upper quadrant pain: Secondary | ICD-10-CM | POA: Insufficient documentation

## 2023-11-08 DIAGNOSIS — R1084 Generalized abdominal pain: Secondary | ICD-10-CM | POA: Diagnosis not present

## 2023-11-08 LAB — URINALYSIS, ROUTINE W REFLEX MICROSCOPIC
Bilirubin Urine: NEGATIVE
Glucose, UA: NEGATIVE mg/dL
Hgb urine dipstick: NEGATIVE
Ketones, ur: NEGATIVE mg/dL
Leukocytes,Ua: NEGATIVE
Nitrite: NEGATIVE
Protein, ur: NEGATIVE mg/dL
Specific Gravity, Urine: 1.023 (ref 1.005–1.030)
pH: 7 (ref 5.0–8.0)

## 2023-11-08 LAB — COMPREHENSIVE METABOLIC PANEL WITH GFR
ALT: 19 U/L (ref 0–44)
AST: 18 U/L (ref 15–41)
Albumin: 4 g/dL (ref 3.5–5.0)
Alkaline Phosphatase: 58 U/L (ref 38–126)
Anion gap: 10 (ref 5–15)
BUN: 14 mg/dL (ref 6–20)
CO2: 23 mmol/L (ref 22–32)
Calcium: 8.9 mg/dL (ref 8.9–10.3)
Chloride: 104 mmol/L (ref 98–111)
Creatinine, Ser: 0.74 mg/dL (ref 0.44–1.00)
GFR, Estimated: >60 mL/min (ref >60)
Glucose, Bld: 110 mg/dL — ABNORMAL HIGH (ref 70–99)
Potassium: 3.2 mmol/L — ABNORMAL LOW (ref 3.5–5.1)
Sodium: 137 mmol/L (ref 135–145)
Total Bilirubin: 0.4 mg/dL (ref 0.0–1.2)
Total Protein: 7.4 g/dL (ref 6.5–8.1)

## 2023-11-08 LAB — POC URINE PREG, ED: Preg Test, Ur: NEGATIVE

## 2023-11-08 LAB — CBC WITH DIFFERENTIAL/PLATELET
Abs Immature Granulocytes: 0.07 10*3/uL (ref 0.00–0.07)
Basophils Absolute: 0.1 10*3/uL (ref 0.0–0.1)
Basophils Relative: 0 %
Eosinophils Absolute: 0.1 10*3/uL (ref 0.0–0.5)
Eosinophils Relative: 1 %
HCT: 47.2 % — ABNORMAL HIGH (ref 36.0–46.0)
Hemoglobin: 15.9 g/dL — ABNORMAL HIGH (ref 12.0–15.0)
Immature Granulocytes: 1 %
Lymphocytes Relative: 13 %
Lymphs Abs: 1.7 10*3/uL (ref 0.7–4.0)
MCH: 29.4 pg (ref 26.0–34.0)
MCHC: 33.7 g/dL (ref 30.0–36.0)
MCV: 87.4 fL (ref 80.0–100.0)
Monocytes Absolute: 0.6 10*3/uL (ref 0.1–1.0)
Monocytes Relative: 5 %
Neutro Abs: 10.6 10*3/uL — ABNORMAL HIGH (ref 1.7–7.7)
Neutrophils Relative %: 80 %
Platelets: 251 10*3/uL (ref 150–400)
RBC: 5.4 MIL/uL — ABNORMAL HIGH (ref 3.87–5.11)
RDW: 13.1 % (ref 11.5–15.5)
WBC: 13.1 10*3/uL — ABNORMAL HIGH (ref 4.0–10.5)
nRBC: 0 % (ref 0.0–0.2)

## 2023-11-08 LAB — PREGNANCY, URINE: Preg Test, Ur: NEGATIVE

## 2023-11-08 LAB — LIPASE, BLOOD: Lipase: 28 U/L (ref 11–51)

## 2023-11-08 MED ORDER — MELOXICAM 15 MG PO TABS: 15.0000 mg | ORAL_TABLET | Freq: Every day | ORAL | 0 refills | Status: AC

## 2023-11-08 MED ORDER — DICYCLOMINE HCL 10 MG PO CAPS
10.0000 mg | ORAL_CAPSULE | Freq: Once | ORAL | Status: AC
  Administered 2023-11-08: 10 mg via ORAL
  Filled 2023-11-08: qty 1

## 2023-11-08 MED ORDER — ONDANSETRON 4 MG PO TBDP: 4.0000 mg | ORAL_TABLET | Freq: Three times a day (TID) | ORAL | 0 refills | Status: AC | PRN

## 2023-11-08 MED ORDER — SODIUM CHLORIDE 0.9 % IV BOLUS
1000.0000 mL | Freq: Once | INTRAVENOUS | Status: AC
  Administered 2023-11-08: 1000 mL via INTRAVENOUS

## 2023-11-08 MED ORDER — ONDANSETRON HCL 4 MG/2ML IJ SOLN
4.0000 mg | Freq: Once | INTRAMUSCULAR | Status: AC
  Administered 2023-11-08: 4 mg via INTRAVENOUS
  Filled 2023-11-08: qty 2

## 2023-11-08 MED ORDER — MORPHINE SULFATE (PF) 4 MG/ML IV SOLN
4.0000 mg | Freq: Once | INTRAVENOUS | Status: AC
  Administered 2023-11-08: 4 mg via INTRAVENOUS
  Filled 2023-11-08: qty 1

## 2023-11-08 MED ORDER — IOHEXOL 350 MG/ML SOLN
100.0000 mL | Freq: Once | INTRAVENOUS | Status: AC | PRN
  Administered 2023-11-08: 100 mL via INTRAVENOUS

## 2023-11-08 MED ORDER — DICYCLOMINE HCL 10 MG PO CAPS: 10.0000 mg | ORAL_CAPSULE | Freq: Three times a day (TID) | ORAL | 0 refills | Status: AC

## 2023-11-08 NOTE — ED Provider Triage Note (Signed)
 Emergency Medicine Provider Triage Evaluation Note  Angelica Fisher , a 33 y.o. female  was evaluated in triage.  Pt complains of RLQ abdominal pain.  Review of Systems  Positive: Abdominal pain, nausea  Negative: fever  Physical Exam  There were no vitals taken for this visit. Gen:   Awake, no distress   Resp:  Normal effort  MSK:   Moves extremities without difficulty  Other:    Medical Decision Making  Medically screening exam initiated at 5:02 PM.  Appropriate orders placed.  Angelica Fisher was informed that the remainder of the evaluation will be completed by another provider, this initial triage assessment does not replace that evaluation, and the importance of remaining in the ED until their evaluation is complete.     Cameron Ali, PA-C 11/08/23 1703

## 2023-11-08 NOTE — ED Provider Notes (Signed)
 [facility] Provider Note  Patient Contact: 9:49 PM (approximate)   History   Abdominal Pain   HPI  Angelica Fisher is a 33 y.o. female who presents the emergency department complaining of right lower quadrant abdominal pain patient states that this began today.  She has had some nausea but no vomiting.  No diarrhea or constipation.  Patient states that she does have some pain in the right upper quadrant as well though most of it is localized to the right lower quadrant.  Patient had this issue several months ago and states that the workup at that time was reassuring.  Patient states that the symptoms at that time finally resolved.  She has had a return in the right lower quadrant.  He still has her appendix.  She still has her gallbladder.  No fevers.  Patient has had C-sections and bilateral tubal.     Physical Exam   Triage Vital Signs: ED Triage Vitals [11/08/23 1702]  Encounter Vitals Group     BP (!) 134/98     Systolic BP Percentile      Diastolic BP Percentile      Pulse Rate 67     Resp 17     Temp 99.2 F (37.3 C)     Temp Source Oral     SpO2 99 %     Weight 240 lb (108.9 kg)     Height 5\' 9"  (1.753 m)     Head Circumference      Peak Flow      Pain Score 6     Pain Loc      Pain Education      Exclude from Growth Chart     Most recent vital signs: Vitals:   11/08/23 2028 11/08/23 2157  BP: 127/85   Pulse: 70   Resp: 20   Temp:  98 F (36.7 C)  SpO2: 99%      General: Alert and in no acute distress.  Cardiovascular:  Good peripheral perfusion Respiratory: Normal respiratory effort without tachypnea or retractions. Lungs CTAB.  Gastrointestinal: Bowel sounds 4 quadrants.  Patient's abdomen is soft to palpation.  No tenderness on the left side.  She has mild tenderness in the right upper quadrant, worsening tenderness in the right lower quadrant.  No rebound tenderness.  No Rovsing's or obturators.. No guarding or rigidity. No  palpable masses. No distention. No CVA tenderness. Musculoskeletal: Full range of motion to all extremities.  Neurologic:  No gross focal neurologic deficits are appreciated.  Skin:   No rash noted Other:   ED Results / Procedures / Treatments   Labs (all labs ordered are listed, but only abnormal results are displayed) Labs Reviewed  COMPREHENSIVE METABOLIC PANEL WITH GFR - Abnormal; Notable for the following components:      Result Value   Potassium 3.2 (*)    Glucose, Bld 110 (*)    All other components within normal limits  CBC WITH DIFFERENTIAL/PLATELET - Abnormal; Notable for the following components:   WBC 13.1 (*)    RBC 5.40 (*)    Hemoglobin 15.9 (*)    HCT 47.2 (*)    Neutro Abs 10.6 (*)    All other components within normal limits  URINALYSIS, ROUTINE W REFLEX MICROSCOPIC - Abnormal; Notable for the following components:   Color, Urine YELLOW (*)    APPearance HAZY (*)    All other components within normal limits  LIPASE, BLOOD  PREGNANCY, URINE  POC URINE PREG, ED     EKG     RADIOLOGY  I personally viewed, evaluated, and interpreted these images as part of my medical decision making, as well as reviewing the written report by the radiologist.  ED Provider Interpretation: CT scan of the right lower quadrant without acute finding.  CT ABDOMEN PELVIS W CONTRAST Result Date: 11/08/2023 CLINICAL DATA:  RLQ abdominal pain EXAM: CT ABDOMEN AND PELVIS WITH CONTRAST TECHNIQUE: Multidetector CT imaging of the abdomen and pelvis was performed using the standard protocol following bolus administration of intravenous contrast. RADIATION DOSE REDUCTION: This exam was performed according to the departmental dose-optimization program which includes automated exposure control, adjustment of the mA and/or kV according to patient size and/or use of iterative reconstruction technique. CONTRAST:  OMNIPAQUE IOHEXOL 350 MG/ML SOLN COMPARISON:  CT renal 08/23/2023 FINDINGS:  Lower chest: No acute abnormality. Hepatobiliary: Stable enlarged liver measuring up to 21.5 cm. No focal liver abnormality. No gallstones, gallbladder wall thickening, or pericholecystic fluid. No biliary dilatation. Pancreas: No focal lesion. Normal pancreatic contour. No surrounding inflammatory changes. No main pancreatic ductal dilatation. Spleen: Stable enlarged spleen measuring up to 13.5 cm. No focal splenic lesion. Adrenals/Urinary Tract: No adrenal nodule bilaterally. Bilateral kidneys enhance symmetrically. Subcentimeter hypodense lesion too small to characterize-no further follow-up indicated. No hydronephrosis. No hydroureter.  No nephroureterolithiasis. The urinary bladder is unremarkable. Stomach/Bowel: Stomach is within normal limits. No evidence of bowel wall thickening or dilatation. Appendix appears normal. Vascular/Lymphatic:No abdominal aorta or iliac aneurysm. No abdominal, pelvic, or inguinal lymphadenopathy. Reproductive: Uterus and bilateral adnexa are unremarkable. Other: No intraperitoneal free fluid. No intraperitoneal free gas. No organized fluid collection. Musculoskeletal: Tiny fat containing umbilical hernia. Tiny fat containing infraumbilical hernia (6:75). No suspicious lytic or blastic osseous lesions. No acute displaced fracture. IMPRESSION: 1. No acute intra-abdominal or intrapelvic abnormality. 2. Stable hepatosplenomegaly. Electronically Signed   By: Tish Frederickson M.D.   On: 11/08/2023 22:19    PROCEDURES:  Critical Care performed: No  Procedures   MEDICATIONS ORDERED IN ED: Medications  ondansetron [facility]) injection 4 mg (4 mg Intravenous Given 11/08/23 2118)  morphine (PF) 4 MG/ML injection 4 mg (4 mg Intravenous Given 11/08/23 2119)  sodium chloride 0.9 % bolus 1,000 mL (1,000 mLs Intravenous Bolus 11/08/23 2122)  iohexol (OMNIPAQUE) 350 MG/ML injection 100 mL (100 mLs Intravenous Contrast Given 11/08/23 2206)  dicyclomine (BENTYL) capsule 10 mg (10 mg Oral Given  11/08/23 2243)     IMPRESSION / MDM / ASSESSMENT AND PLAN / ED COURSE  I reviewed the triage vital signs and the nursing notes.                                 Differential diagnosis includes, but is not limited to, colitis, appendicitis, cholecystitis, hernia, scar tissue, pregnancy, ectopic  Patient's presentation is most consistent with acute presentation with potential threat to life or bodily function.   Patient's diagnosis is consistent with right lower quadrant abdominal pain.  Patient presents to the emergency department with right lower quadrant and right upper quadrant abdominal pain.  This began today.  She had a similar episode several months ago that spontaneously resolved.  Patient has had some nausea but no vomiting.  No diarrhea or constipation.  No urinary changes.  No vaginal bleeding or discharge.  Patient had diffuse tenderness on the right worse in the right lower quadrant then right upper quadrant.  Labs revealed slight  elevation in white blood cell count but otherwise were largely reassuring.  CT scan revealed no acute findings at this time.  Will try Bentyl for symptom relief.  Concerning signs and symptoms warranting return to the emergency department are discussed.  Otherwise follow-up with primary care or GI.Marland Kitchen  Patient is given ED precautions to return to the ED for any worsening or new symptoms.     FINAL CLINICAL IMPRESSION(S) / ED DIAGNOSES   Final diagnoses:  RLQ abdominal pain     Rx / DC Orders   ED Discharge Orders          Ordered    dicyclomine (BENTYL) 10 MG capsule  3 times daily before meals & bedtime        11/08/23 2248    ondansetron (ZOFRAN-ODT) 4 MG disintegrating tablet  Every 8 hours PRN        11/08/23 2248    meloxicam (MOBIC) 15 MG tablet  Daily        11/08/23 2248             Note:  This document was prepared using Dragon voice recognition software and may include unintentional dictation errors.   Racheal Patches,  PA-C 11/08/23 2248    Minna Antis, MD 11/08/23 [phone]

## 2023-11-08 NOTE — ED Triage Notes (Signed)
 Patient to ED via [facility] from work for RLQ abd pain. Pain started today with nausea. Seen a couple months ago for same.  Denies V/D.

## 2023-11-08 NOTE — ED Notes (Signed)
 Pt ambulated to the restroom and back

## 2023-11-08 NOTE — ED Notes (Signed)
 PT is CAOx4, breathing normally, and normal in color. Pt is sitting upright in the bed with legs crossed and is complaining of 10/10 abdominal pain orienting more on the right side that comes and goes. Pt is in obvious distress when in pain. Pt family member sitting with her at this time.

## 2023-12-29 DIAGNOSIS — Z1389 Encounter for screening for other disorder: Secondary | ICD-10-CM | POA: Diagnosis not present

## 2023-12-29 DIAGNOSIS — Z1331 Encounter for screening for depression: Secondary | ICD-10-CM | POA: Diagnosis not present

## 2023-12-29 DIAGNOSIS — R002 Palpitations: Secondary | ICD-10-CM | POA: Diagnosis not present

## 2023-12-29 DIAGNOSIS — I10 Essential (primary) hypertension: Secondary | ICD-10-CM | POA: Diagnosis not present

## 2023-12-29 DIAGNOSIS — R1084 Generalized abdominal pain: Secondary | ICD-10-CM | POA: Diagnosis not present

## 2023-12-29 DIAGNOSIS — M545 Low back pain, unspecified: Secondary | ICD-10-CM | POA: Diagnosis not present

## 2023-12-29 DIAGNOSIS — Z0131 Encounter for examination of blood pressure with abnormal findings: Secondary | ICD-10-CM | POA: Diagnosis not present

## 2024-01-25 DIAGNOSIS — M5459 Other low back pain: Secondary | ICD-10-CM | POA: Diagnosis not present

## 2024-01-25 DIAGNOSIS — M9933 Osseous stenosis of neural canal of lumbar region: Secondary | ICD-10-CM | POA: Diagnosis not present

## 2024-01-25 DIAGNOSIS — M4807 Spinal stenosis, lumbosacral region: Secondary | ICD-10-CM | POA: Diagnosis not present

## 2024-02-07 ENCOUNTER — Other Ambulatory Visit: Payer: Self-pay | Admitting: Orthopedic Surgery

## 2024-02-07 DIAGNOSIS — M4807 Spinal stenosis, lumbosacral region: Secondary | ICD-10-CM

## 2024-02-11 ENCOUNTER — Ambulatory Visit
Admission: RE | Admit: 2024-02-11 | Discharge: 2024-02-11 | Disposition: A | Discharge status: Home / Self Care | Source: Ambulatory Visit | Attending: Orthopedic Surgery | Admitting: Orthopedic Surgery

## 2024-02-11 DIAGNOSIS — M47816 Spondylosis without myelopathy or radiculopathy, lumbar region: Secondary | ICD-10-CM | POA: Diagnosis not present

## 2024-02-11 DIAGNOSIS — M4807 Spinal stenosis, lumbosacral region: Secondary | ICD-10-CM

## 2024-02-11 DIAGNOSIS — M5126 Other intervertebral disc displacement, lumbar region: Secondary | ICD-10-CM | POA: Diagnosis not present

## 2024-02-14 DIAGNOSIS — R1084 Generalized abdominal pain: Secondary | ICD-10-CM | POA: Diagnosis not present

## 2024-02-14 DIAGNOSIS — Z599 Problem related to housing and economic circumstances, unspecified: Secondary | ICD-10-CM | POA: Diagnosis not present

## 2024-02-14 DIAGNOSIS — K529 Noninfective gastroenteritis and colitis, unspecified: Secondary | ICD-10-CM | POA: Diagnosis not present

## 2024-02-28 DIAGNOSIS — M216X2 Other acquired deformities of left foot: Secondary | ICD-10-CM | POA: Diagnosis not present

## 2024-02-28 DIAGNOSIS — M545 Low back pain, unspecified: Secondary | ICD-10-CM | POA: Diagnosis not present

## 2024-02-28 DIAGNOSIS — M79671 Pain in right foot: Secondary | ICD-10-CM | POA: Diagnosis not present

## 2024-02-28 DIAGNOSIS — M722 Plantar fascial fibromatosis: Secondary | ICD-10-CM | POA: Diagnosis not present

## 2024-02-28 DIAGNOSIS — G8929 Other chronic pain: Secondary | ICD-10-CM | POA: Diagnosis not present

## 2024-02-28 DIAGNOSIS — M216X1 Other acquired deformities of right foot: Secondary | ICD-10-CM | POA: Diagnosis not present

## 2024-03-09 ENCOUNTER — Ambulatory Visit
Admission: RE | Admit: 2024-03-09 | Discharge: 2024-03-09 | Disposition: A | Discharge status: Home / Self Care | Attending: Gastroenterology | Admitting: Gastroenterology

## 2024-03-09 ENCOUNTER — Ambulatory Visit: Admitting: Anesthesiology

## 2024-03-09 ENCOUNTER — Encounter
Admission: RE | Disposition: A | Discharge status: Home / Self Care | Payer: Self-pay | Source: Home / Self Care | Attending: Gastroenterology

## 2024-03-09 ENCOUNTER — Other Ambulatory Visit: Payer: Self-pay

## 2024-03-09 DIAGNOSIS — K529 Noninfective gastroenteritis and colitis, unspecified: Secondary | ICD-10-CM | POA: Insufficient documentation

## 2024-03-09 DIAGNOSIS — K633 Ulcer of intestine: Secondary | ICD-10-CM | POA: Diagnosis not present

## 2024-03-09 DIAGNOSIS — K6389 Other specified diseases of intestine: Secondary | ICD-10-CM | POA: Insufficient documentation

## 2024-03-09 DIAGNOSIS — R1084 Generalized abdominal pain: Secondary | ICD-10-CM | POA: Diagnosis not present

## 2024-03-09 DIAGNOSIS — Z800 Family history of malignant neoplasm of digestive organs: Secondary | ICD-10-CM | POA: Diagnosis not present

## 2024-03-09 DIAGNOSIS — I10 Essential (primary) hypertension: Secondary | ICD-10-CM | POA: Diagnosis not present

## 2024-03-09 DIAGNOSIS — Q402 Other specified congenital malformations of stomach: Secondary | ICD-10-CM | POA: Diagnosis not present

## 2024-03-09 DIAGNOSIS — K3189 Other diseases of stomach and duodenum: Secondary | ICD-10-CM | POA: Diagnosis not present

## 2024-03-09 DIAGNOSIS — K5000 Crohn's disease of small intestine without complications: Secondary | ICD-10-CM | POA: Diagnosis not present

## 2024-03-09 DIAGNOSIS — Z6836 Body mass index (BMI) 36.0-36.9, adult: Secondary | ICD-10-CM | POA: Diagnosis not present

## 2024-03-09 DIAGNOSIS — K2970 Gastritis, unspecified, without bleeding: Secondary | ICD-10-CM | POA: Insufficient documentation

## 2024-03-09 DIAGNOSIS — E669 Obesity, unspecified: Secondary | ICD-10-CM | POA: Diagnosis not present

## 2024-03-09 DIAGNOSIS — K649 Unspecified hemorrhoids: Secondary | ICD-10-CM | POA: Diagnosis not present

## 2024-03-09 HISTORY — PX: COLONOSCOPY: SHX5424

## 2024-03-09 HISTORY — PX: ESOPHAGOGASTRODUODENOSCOPY: SHX5428

## 2024-03-09 LAB — POCT PREGNANCY, URINE: Preg Test, Ur: NEGATIVE

## 2024-03-09 SURGERY — COLONOSCOPY
Anesthesia: General

## 2024-03-09 MED ORDER — PROPOFOL 500 MG/50ML IV EMUL
INTRAVENOUS | Status: DC | PRN
Start: 2024-03-09 — End: 2024-03-09
  Administered 2024-03-09: 200 ug/kg/min via INTRAVENOUS

## 2024-03-09 MED ORDER — LIDOCAINE HCL (PF) 2 % IJ SOLN
INTRAMUSCULAR | Status: AC
  Filled 2024-03-09: qty 5

## 2024-03-09 MED ORDER — DEXMEDETOMIDINE HCL IN NACL 80 MCG/20ML IV SOLN
INTRAVENOUS | Status: DC | PRN
  Administered 2024-03-09: 20 ug via INTRAVENOUS

## 2024-03-09 MED ORDER — SIMETHICONE 40 MG/0.6ML PO SUSP
ORAL | Status: DC | PRN
  Administered 2024-03-09: 60 mL

## 2024-03-09 MED ORDER — GLYCOPYRROLATE 0.2 MG/ML IJ SOLN
INTRAMUSCULAR | Status: AC
Start: 2024-03-09 — End: 2024-03-09
  Filled 2024-03-09: qty 2

## 2024-03-09 MED ORDER — PROPOFOL 10 MG/ML IV BOLUS
INTRAVENOUS | Status: DC | PRN
  Administered 2024-03-09 (×2): 50 mg via INTRAVENOUS

## 2024-03-09 MED ORDER — LIDOCAINE HCL (CARDIAC) PF 100 MG/5ML IV SOSY
PREFILLED_SYRINGE | INTRAVENOUS | Status: DC | PRN
  Administered 2024-03-09: 100 mg via INTRAVENOUS

## 2024-03-09 MED ORDER — SODIUM CHLORIDE 0.9 % IV SOLN: INTRAVENOUS | Status: DC

## 2024-03-09 MED ORDER — GLYCOPYRROLATE 0.2 MG/ML IJ SOLN
INTRAMUSCULAR | Status: DC | PRN
Start: 2024-03-09 — End: 2024-03-09
  Administered 2024-03-09: .2 mg via INTRAVENOUS

## 2024-03-09 NOTE — Op Note (Addendum)
 [facility] Gastroenterology Patient Name: Angelica Fisher Procedure Date: 03/09/2024 11:25 AM MRN: [id number] Account #: [id number] Date of Birth: 06-05-1991 Admit Type: Outpatient Age: 33 Room: [facility] ENDO ROOM 3 Gender: Female Note Status: Supervisor Override Instrument Name: [id number] [id number] Procedure:             Colonoscopy Indications:           Generalized abdominal pain, Chronic diarrhea Providers:             Ole Schick MD, MD Medicines:             Monitored Anesthesia Care Complications:         No immediate complications. Estimated blood loss:                         Minimal. Procedure:             Pre-Anesthesia Assessment:                        - Prior to the procedure, a History and Physical was                         performed, and patient medications and allergies were                         reviewed. The patient is competent. The risks and                         benefits of the procedure and the sedation options and                         risks were discussed with the patient. All questions                         were answered and informed consent was obtained.                         Patient identification and proposed procedure were                         verified by the physician, the nurse, the                         anesthesiologist, the anesthetist and the technician                         in the endoscopy suite. Mental Status Examination:                         alert and oriented. Airway Examination: normal                         oropharyngeal airway and neck mobility. Respiratory                         Examination: clear to auscultation. CV Examination:                         normal. Prophylactic Antibiotics: The patient does not  require prophylactic antibiotics. Prior                         Anticoagulants: The patient has taken no anticoagulant                         or antiplatelet agents. ASA  Grade Assessment: II - A                         patient with mild systemic disease. After reviewing                         the risks and benefits, the patient was deemed in                         satisfactory condition to undergo the procedure. The                         anesthesia plan was to use monitored anesthesia care                         (MAC). Immediately prior to administration of                         medications, the patient was re-assessed for adequacy                         to receive sedatives. The heart rate, respiratory                         rate, oxygen saturations, blood pressure, adequacy of                         pulmonary ventilation, and response to care were                         monitored throughout the procedure. The physical                         status of the patient was re-assessed after the                         procedure.                        After obtaining informed consent, the colonoscope was                         passed under direct vision. Throughout the procedure,                         the patient's blood pressure, pulse, and oxygen                         saturations were monitored continuously. The                         Colonoscope was introduced through the anus and  advanced to the the terminal ileum, with                         identification of the appendiceal orifice and IC                         valve. The colonoscopy was performed without                         difficulty. The patient tolerated the procedure well.                         The quality of the bowel preparation was good. The                         terminal ileum, ileocecal valve, appendiceal orifice,                         and rectum were photographed. Findings:      The perianal and digital rectal examinations were normal.      The terminal ileum contained multiple patchy non-bleeding aphthae. This       was biopsied with a  cold forceps for histology. Estimated blood loss was       minimal.      The exam was otherwise without abnormality on direct and retroflexion       views. Impression:            - Aphtha in the terminal ileum. Biopsied.                        - The examination was otherwise normal on direct and                         retroflexion views. Recommendation:        - Discharge patient to home.                        - Resume previous diet.                        - Continue present medications.                        - Await pathology results.                        - Repeat colonoscopy PRN to check healing.                        - Return to referring physician as previously                         scheduled. Procedure Code(s):     --- Professional ---                        [phone], Colonoscopy, flexible; with biopsy, single or                         multiple Diagnosis Code(s):     --- Professional ---  K63.89, Other specified diseases of intestine                        R10.84, Generalized abdominal pain CPT copyright 2022 American [employer] Association. All rights reserved. The codes documented in this report are preliminary and upon coder review may  be revised to meet current compliance requirements. Ole Schick MD, MD 03/09/2024 12:03:48 PM Number of Addenda: 0 Note Initiated On: 03/09/2024 11:25 AM Scope Withdrawal Time: 0 hours 7 minutes 5 seconds  Total Procedure Duration: 0 hours 10 minutes 25 seconds  Estimated Blood Loss:  Estimated blood loss was minimal.      [facility]

## 2024-03-09 NOTE — H&P (Signed)
 Outpatient short stay form Pre-procedure 03/09/2024  Ole ONEIDA Schick, MD  Primary Physician: [facility]  Reason for visit:  Abdominal pain  History of present illness:    33 y/o lady with history of hypertension and obesity here for EGD/Colonoscopy for chronic abdominal pain and loose stools. No blood thinners. Mother with history of stomach cancer and maybe IBD. History of c-sections.     Current Facility-Administered Medications:    0.9 %  sodium chloride  infusion, , Intravenous, Continuous, Prisha Hiley, Ole ONEIDA, MD, Last Rate: 20 mL/hr at 03/09/24 1123, New Bag at 03/09/24 1123  Medications Prior to Admission  Medication Sig Dispense Refill Last Dose/Taking   hydrochlorothiazide (HYDRODIURIL) 25 MG tablet Take 25 mg by mouth daily.   03/08/2024   acetaminophen  (TYLENOL ) 500 MG tablet Take 2 tablets (1,000 mg total) by mouth every 6 (six) hours. (Patient not taking: Reported on 02/17/2022) 30 tablet 0    calcium  carbonate (TUMS - DOSED IN MG ELEMENTAL CALCIUM ) 500 MG chewable tablet Chew 2 tablets (400 mg of elemental calcium  total) by mouth every 8 (eight) hours as needed for heartburn or indigestion. (Patient not taking: Reported on 02/17/2022)      coconut oil OIL Apply 1 application topically as needed. (Patient not taking: Reported on 02/17/2022)  0    dicyclomine  (BENTYL ) 10 MG capsule Take 1 capsule (10 mg total) by mouth 4 (four) times daily -  before meals and at bedtime. (Patient not taking: Reported on 03/09/2024) 30 capsule 0 Not Taking   famotidine (PEPCID) 10 MG tablet Take 20 mg by mouth in the morning and at bedtime. (Patient not taking: Reported on 02/17/2022)      ibuprofen  (ADVIL ) 600 MG tablet Take 1 tablet (600 mg total) by mouth every 6 (six) hours. (Patient not taking: Reported on 02/17/2022) 60 tablet 0    labetalol  (NORMODYNE ) 200 MG tablet Take 1 tablet (200 mg total) by mouth 2 (two) times daily. (Patient not taking: Reported on 03/09/2024) 60  tablet 0 Not Taking   Levonorgestrel-Ethinyl Estrad (AMETHYST PO) Take by mouth.      meloxicam  (MOBIC ) 15 MG tablet Take 1 tablet (15 mg total) by mouth daily. (Patient not taking: Reported on 03/09/2024) 30 tablet 0 Not Taking   ondansetron  (ZOFRAN -ODT) 4 MG disintegrating tablet Take 1 tablet (4 mg total) by mouth every 8 (eight) hours as needed. 20 tablet 0    Prenat-FeAsp-Meth-FA-DHA w/o A (PRENATE PIXIE) 10-0.6-0.4-200 MG CAPS Take 1 capsule by mouth daily. (Patient not taking: Reported on 02/17/2022)      senna-docusate (SENOKOT-S) 8.6-50 MG tablet Take 2 tablets by mouth daily. (Patient not taking: Reported on 02/17/2022) 60 tablet 0    simethicone  (MYLICON) 80 MG chewable tablet Chew 1 tablet (80 mg total) by mouth 3 (three) times daily after meals. (Patient not taking: Reported on 02/17/2022) 30 tablet 0      No Known Allergies   Past Medical History:  Diagnosis Date   GERD (gastroesophageal reflux disease)    Gestational diabetes    History of kidney stones    Hypertension    PONV (postoperative nausea and vomiting)     Review of systems:  Otherwise negative.    Physical Exam  Gen: Alert, oriented. Appears stated age.  HEENT: PERRLA. Lungs: No respiratory distress CV: RRR Abd: soft, benign, no masses Ext: No edema    Planned procedures: Proceed with EGD/colonoscopy. The patient understands the nature of the planned procedure, indications, risks, alternatives and potential  complications including but not limited to bleeding, infection, perforation, damage to internal organs and possible oversedation/side effects from anesthesia. The patient agrees and gives consent to proceed.  Please refer to procedure notes for findings, recommendations and patient disposition/instructions.     Ole ONEIDA Schick, MD [facility] Gastroenterology

## 2024-03-09 NOTE — Transfer of Care (Signed)
 Immediate Anesthesia Transfer of Care Note  Patient: Angelica Fisher  Procedure(s) Performed: COLONOSCOPY EGD (ESOPHAGOGASTRODUODENOSCOPY)  Patient Location: PACU  Anesthesia Type:General  Level of Consciousness: sedated  Airway & Oxygen Therapy: Patient Spontanous Breathing  Post-op Assessment: Report given to RN and Post -op Vital signs reviewed and stable  Post vital signs: Reviewed and stable  Last Vitals:  Vitals Value Taken Time  BP    Temp    Pulse 69 03/09/24 12:00  Resp 22 03/09/24 12:00  SpO2 98 % 03/09/24 12:00  Vitals shown include unfiled device data.  Last Pain:  Vitals:   03/09/24 1124  TempSrc: Temporal  PainSc: 0-No pain         Complications: No notable events documented.

## 2024-03-09 NOTE — Anesthesia Postprocedure Evaluation (Signed)
 Anesthesia Post Note  Patient: Angelica Fisher  Procedure(s) Performed: COLONOSCOPY EGD (ESOPHAGOGASTRODUODENOSCOPY)  Patient location during evaluation: Endoscopy Anesthesia Type: General Level of consciousness: awake and alert Pain management: pain level controlled Vital Signs Assessment: post-procedure vital signs reviewed and stable Respiratory status: spontaneous breathing, nonlabored ventilation and respiratory function stable Cardiovascular status: blood pressure returned to baseline and stable Postop Assessment: no apparent nausea or vomiting Anesthetic complications: no   No notable events documented.   Last Vitals:  Vitals:   03/09/24 1210 03/09/24 1220  BP: 96/68 98/67  Pulse: (!) 58 66  Resp: 20 17  Temp:    SpO2: 99% 100%    Last Pain:  Vitals:   03/09/24 1220  TempSrc:   PainSc: 0-No pain                 Camellia Merilee Louder

## 2024-03-09 NOTE — Interval H&P Note (Signed)
 History and Physical Interval Note:  03/09/2024 11:30 AM  Angelica Fisher  has presented today for surgery, with the diagnosis of Gen Abd Pain Diarrhea.  The various methods of treatment have been discussed with the patient and family. After consideration of risks, benefits and other options for treatment, the patient has consented to  Procedure(s): COLONOSCOPY (N/A) EGD (ESOPHAGOGASTRODUODENOSCOPY) (N/A) as a surgical intervention.  The patient's history has been reviewed, patient examined, no change in status, stable for surgery.  I have reviewed the patient's chart and labs.  Questions were answered to the patient's satisfaction.     Angelica Fisher  Ok to proceed with EGD/Colonoscopy

## 2024-03-09 NOTE — Op Note (Signed)
 [facility] Patient Name: Angelica Fisher Procedure Date: 03/09/2024 11:25 AM MRN: [id number] Account #: [id number] Date of Birth: 1990-08-26 Admit Type: Outpatient Age: 33 Room: [facility] ENDO ROOM 3 Gender: Female Note Status: Finalized Instrument Name: Barnie Endoscope [id number] Procedure:             Upper GI endoscopy Indications:           Generalized abdominal pain Providers:             Ole Schick MD, MD Medicines:             Monitored Anesthesia Care Complications:         No immediate complications. Estimated blood loss:                         Minimal. Procedure:             Pre-Anesthesia Assessment:                        - Prior to the procedure, a History and Physical was                         performed, and patient medications and allergies were                         reviewed. The patient is competent. The risks and                         benefits of the procedure and the sedation options and                         risks were discussed with the patient. All questions                         were answered and informed consent was obtained.                         Patient identification and proposed procedure were                         verified by the physician, the nurse, the                         anesthesiologist, the anesthetist and the technician                         in the endoscopy suite. Mental Status Examination:                         alert and oriented. Airway Examination: normal                         oropharyngeal airway and neck mobility. Respiratory                         Examination: clear to auscultation. CV Examination:                         normal. Prophylactic Antibiotics: The patient does not  require prophylactic antibiotics. Prior                         Anticoagulants: The patient has taken no anticoagulant                         or antiplatelet agents. ASA Grade Assessment:  II - A                         patient with mild systemic disease. After reviewing                         the risks and benefits, the patient was deemed in                         satisfactory condition to undergo the procedure. The                         anesthesia plan was to use monitored anesthesia care                         (MAC). Immediately prior to administration of                         medications, the patient was re-assessed for adequacy                         to receive sedatives. The heart rate, respiratory                         rate, oxygen saturations, blood pressure, adequacy of                         pulmonary ventilation, and response to care were                         monitored throughout the procedure. The physical                         status of the patient was re-assessed after the                         procedure.                        After obtaining informed consent, the endoscope was                         passed under direct vision. Throughout the procedure,                         the patient's blood pressure, pulse, and oxygen                         saturations were monitored continuously. The Endoscope                         was introduced through the mouth, and advanced to the  second part of duodenum. The upper GI endoscopy was                         accomplished without difficulty. The patient tolerated                         the procedure well. 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      The exam of the esophagus was otherwise normal.      Patchy mildly erythematous mucosa without bleeding was found in the       gastric antrum. Biopsies were taken with a cold forceps for histology.       Estimated blood loss was minimal.      The examined duodenum was normal. Impression:            - Ectopic gastric mucosa in the upper third of the                          esophagus.                        - Erythematous mucosa in the antrum. Biopsied.                        - Normal examined duodenum. Recommendation:        - Discharge patient to home.                        - Resume previous diet.                        - Continue present medications.                        - Await pathology results.                        - Return to referring physician as previously                         scheduled. Procedure Code(s):     --- Professional ---                        [phone], Esophagogastroduodenoscopy, flexible,                         transoral; with biopsy, single or multiple Diagnosis Code(s):     --- Professional ---                        Q40.2, Other specified congenital malformations of                         stomach                        K31.89, Other diseases of stomach and duodenum                        R10.84, Generalized abdominal pain CPT  copyright 2022 [employer]. All rights reserved. The codes documented in this report are preliminary and upon coder review may  be revised to meet current compliance requirements. Ole Schick MD, MD 03/09/2024 12:00:48 PM Number of Addenda: 0 Note Initiated On: 03/09/2024 11:25 AM Estimated Blood Loss:  Estimated blood loss was minimal.      [facility]

## 2024-03-09 NOTE — Anesthesia Preprocedure Evaluation (Addendum)
 Anesthesia Evaluation  Patient identified by MRN, date of birth, ID band Patient awake    Reviewed: Allergy & Precautions, H&P , NPO status , Patient's Chart, lab work & pertinent test results  History of Anesthesia Complications (+) PONV and history of anesthetic complications  Airway Mallampati: II  TM Distance: >3 FB Neck ROM: full    Dental no notable dental hx.    Pulmonary Current Smoker, former smoker   Pulmonary exam normal        Cardiovascular hypertension, Normal cardiovascular exam     Neuro/Psych negative neurological ROS  negative psych ROS   GI/Hepatic Neg liver ROS,,,  Endo/Other  negative endocrine ROS    Renal/GU negative Renal ROS  negative genitourinary   Musculoskeletal   Abdominal  (+) + obese  Peds  Hematology negative hematology ROS (+)   Anesthesia Other Findings Past Medical History: No date: GERD (gastroesophageal reflux disease) No date: Gestational diabetes No date: History of kidney stones No date: Hypertension No date: PONV (postoperative nausea and vomiting)  Past Surgical History: 07/09/2010: CESAREAN SECTION 01/02/2021: CESAREAN SECTION WITH BILATERAL TUBAL LIGATION; Bilateral     Comment:  Procedure: REPEAT CESAREAN SECTION WITH BILATERAL TUBAL               LIGATION;  Surgeon: Schermerhorn, Debby PARAS, MD;                Location: [facility] ORS;  Service: Obstetrics;  Laterality:               Bilateral; No date: TONSILLECTOMY     Comment:  age 62 No date: WISDOM TOOTH EXTRACTION     Comment:  age 61     Reproductive/Obstetrics negative OB ROS                              Anesthesia Physical Anesthesia Plan  ASA: 2  Anesthesia Plan: General   Post-op Pain Management:    Induction: Intravenous  PONV Risk Score and Plan: Propofol infusion and TIVA  Airway Management Planned: Natural Airway  Additional Equipment:   Intra-op Plan:    Post-operative Plan:   Informed Consent: I have reviewed the patients History and Physical, chart, labs and discussed the procedure including the risks, benefits and alternatives for the proposed anesthesia with the patient or authorized representative who has indicated his/her understanding and acceptance.     Dental Advisory Given  Plan Discussed with: CRNA and Surgeon  Anesthesia Plan Comments:          Anesthesia Quick Evaluation

## 2024-03-12 LAB — SURGICAL PATHOLOGY

## 2024-03-14 DIAGNOSIS — K5000 Crohn's disease of small intestine without complications: Secondary | ICD-10-CM | POA: Diagnosis not present

## 2024-03-20 DIAGNOSIS — G8929 Other chronic pain: Secondary | ICD-10-CM | POA: Diagnosis not present

## 2024-03-20 DIAGNOSIS — M5442 Lumbago with sciatica, left side: Secondary | ICD-10-CM | POA: Diagnosis not present

## 2024-03-20 DIAGNOSIS — M5441 Lumbago with sciatica, right side: Secondary | ICD-10-CM | POA: Diagnosis not present

## 2024-03-20 DIAGNOSIS — M5416 Radiculopathy, lumbar region: Secondary | ICD-10-CM | POA: Diagnosis not present

## 2024-04-06 DIAGNOSIS — M5416 Radiculopathy, lumbar region: Secondary | ICD-10-CM | POA: Diagnosis not present

## 2024-04-17 DIAGNOSIS — G8929 Other chronic pain: Secondary | ICD-10-CM | POA: Diagnosis not present

## 2024-04-17 DIAGNOSIS — M5441 Lumbago with sciatica, right side: Secondary | ICD-10-CM | POA: Diagnosis not present

## 2024-04-17 DIAGNOSIS — M5442 Lumbago with sciatica, left side: Secondary | ICD-10-CM | POA: Diagnosis not present

## 2024-04-17 DIAGNOSIS — M5416 Radiculopathy, lumbar region: Secondary | ICD-10-CM | POA: Diagnosis not present

## 2024-05-03 DIAGNOSIS — M5416 Radiculopathy, lumbar region: Secondary | ICD-10-CM | POA: Diagnosis not present

## 2024-05-17 DIAGNOSIS — M47816 Spondylosis without myelopathy or radiculopathy, lumbar region: Secondary | ICD-10-CM | POA: Diagnosis not present

## 2024-05-17 DIAGNOSIS — G8929 Other chronic pain: Secondary | ICD-10-CM | POA: Diagnosis not present

## 2024-05-17 DIAGNOSIS — M5441 Lumbago with sciatica, right side: Secondary | ICD-10-CM | POA: Diagnosis not present

## 2024-05-17 DIAGNOSIS — M5442 Lumbago with sciatica, left side: Secondary | ICD-10-CM | POA: Diagnosis not present

## 2024-05-17 DIAGNOSIS — M5416 Radiculopathy, lumbar region: Secondary | ICD-10-CM | POA: Diagnosis not present

## 2024-06-11 DIAGNOSIS — M47816 Spondylosis without myelopathy or radiculopathy, lumbar region: Secondary | ICD-10-CM | POA: Diagnosis not present

## 2024-06-28 DIAGNOSIS — M47816 Spondylosis without myelopathy or radiculopathy, lumbar region: Secondary | ICD-10-CM | POA: Diagnosis not present

## 2024-07-02 DIAGNOSIS — M5441 Lumbago with sciatica, right side: Secondary | ICD-10-CM | POA: Diagnosis not present

## 2024-07-02 DIAGNOSIS — M5442 Lumbago with sciatica, left side: Secondary | ICD-10-CM | POA: Diagnosis not present

## 2024-07-02 DIAGNOSIS — G8929 Other chronic pain: Secondary | ICD-10-CM | POA: Diagnosis not present

## 2024-07-02 DIAGNOSIS — M47816 Spondylosis without myelopathy or radiculopathy, lumbar region: Secondary | ICD-10-CM | POA: Diagnosis not present

## 2024-07-02 DIAGNOSIS — M5416 Radiculopathy, lumbar region: Secondary | ICD-10-CM | POA: Diagnosis not present

## 2024-08-28 ENCOUNTER — Other Ambulatory Visit: Payer: Self-pay | Admitting: Gastroenterology

## 2024-08-28 DIAGNOSIS — R1011 Right upper quadrant pain: Secondary | ICD-10-CM

## 2024-08-28 DIAGNOSIS — K5000 Crohn's disease of small intestine without complications: Secondary | ICD-10-CM

## 2024-09-05 ENCOUNTER — Inpatient Hospital Stay: Admission: RE | Admit: 2024-09-05 | Discharge: 2024-09-05 | Attending: Gastroenterology | Admitting: Gastroenterology

## 2024-09-05 DIAGNOSIS — K5000 Crohn's disease of small intestine without complications: Secondary | ICD-10-CM

## 2024-09-05 DIAGNOSIS — R1011 Right upper quadrant pain: Secondary | ICD-10-CM

## 2024-09-13 ENCOUNTER — Other Ambulatory Visit
